# Patient Record
Sex: Male | Born: 1950 | Race: White | Hispanic: No | Marital: Married | Smoking: Never smoker
Health system: Southern US, Community
[De-identification: ages and names within clinical notes are randomized; demographics above are authoritative.]

## PROBLEM LIST (undated history)

## (undated) DIAGNOSIS — C140 Malignant neoplasm of pharynx, unspecified: Secondary | ICD-10-CM

## (undated) DIAGNOSIS — F419 Anxiety disorder, unspecified: Secondary | ICD-10-CM

## (undated) DIAGNOSIS — I10 Essential (primary) hypertension: Secondary | ICD-10-CM

## (undated) HISTORY — DX: Malignant neoplasm of pharynx, unspecified: C14.0

## (undated) HISTORY — PX: KNEE ARTHROSCOPY: SUR90

## (undated) HISTORY — PX: JOINT REPLACEMENT: SHX530

---

## 1999-10-13 ENCOUNTER — Emergency Department (HOSPITAL_COMMUNITY): Admission: EM | Admit: 1999-10-13 | Discharge: 1999-10-13 | Payer: Self-pay | Admitting: Emergency Medicine

## 1999-10-13 ENCOUNTER — Encounter: Payer: Self-pay | Admitting: Emergency Medicine

## 1999-10-19 ENCOUNTER — Emergency Department (HOSPITAL_COMMUNITY): Admission: EM | Admit: 1999-10-19 | Discharge: 1999-10-19 | Payer: Self-pay | Admitting: Emergency Medicine

## 2000-10-11 ENCOUNTER — Encounter: Payer: Self-pay | Admitting: Neurology

## 2000-10-11 ENCOUNTER — Ambulatory Visit (HOSPITAL_COMMUNITY): Admission: RE | Admit: 2000-10-11 | Discharge: 2000-10-11 | Payer: Self-pay | Admitting: Neurology

## 2003-12-27 ENCOUNTER — Ambulatory Visit (HOSPITAL_COMMUNITY): Admission: RE | Admit: 2003-12-27 | Discharge: 2003-12-27 | Payer: Self-pay | Admitting: Gastroenterology

## 2008-11-15 DIAGNOSIS — I10 Essential (primary) hypertension: Secondary | ICD-10-CM

## 2008-11-15 HISTORY — DX: Essential (primary) hypertension: I10

## 2012-07-04 ENCOUNTER — Other Ambulatory Visit: Payer: Self-pay | Admitting: Orthopedic Surgery

## 2012-07-05 ENCOUNTER — Other Ambulatory Visit: Payer: Self-pay | Admitting: Orthopedic Surgery

## 2012-07-07 ENCOUNTER — Encounter (HOSPITAL_COMMUNITY): Payer: Self-pay | Admitting: Pharmacy Technician

## 2012-07-07 NOTE — Pre-Procedure Instructions (Addendum)
20 Caleb Hoffman  07/07/2012   Your procedure is scheduled on: Thursday, August 30th.  Report to [facility] at 9:15 AM.  Call this number if you have problems the morning of surgery: [phone]   Remember:   Do not eat food or anything to drink:After Midnight.      Take these medicines the morning of surgery with A SIP OF WATER: Gabapentin (Neurontin) and amlodipine.  May take Hydrocodone-Acetaminophen (Norco) or Lorazepam (Ativan).  Stop taking Meloxicam (Mobic) , do not take any Aspirin, NSAIDs or Herbal Medications.   Do not wear jewelry, make-up or nail polish.  Do not wear lotions, powders, or perfumes. You may wear deodorant.  Do not shave 48 hours prior to surgery. Men may shave face and neck.  Do not bring valuables to the hospital.  Contacts, dentures or bridgework may not be worn into surgery.  Leave suitcase in the car. After surgery it may be brought to your room.  For patients admitted to the hospital, checkout time is 11:00 AM the day of discharge.   Patients discharged the day of surgery will not be allowed to drive home.  Name and phone number of your driver: _____________  Special Instructions: Incentive Spirometry - Practice and bring it with you on the day of surgery. and CHG Shower Use Special Wash: 1/2 bottle night before surgery and 1/2 bottle morning of surgery.   Please read over the following fact sheets that you were given: Pain Booklet, Coughing and Deep Breathing, Blood Transfusion Information and Surgical Site Infection Prevention

## 2012-07-10 ENCOUNTER — Encounter (HOSPITAL_COMMUNITY): Payer: Self-pay

## 2012-07-10 ENCOUNTER — Encounter (HOSPITAL_COMMUNITY)
Admission: RE | Admit: 2012-07-10 | Discharge: 2012-07-10 | Disposition: A | Discharge status: Home / Self Care | Payer: 59 | Source: Ambulatory Visit | Attending: Orthopedic Surgery | Admitting: Orthopedic Surgery

## 2012-07-10 HISTORY — DX: Essential (primary) hypertension: I10

## 2012-07-10 HISTORY — DX: Anxiety disorder, unspecified: F41.9

## 2012-07-10 LAB — URINALYSIS, ROUTINE W REFLEX MICROSCOPIC
Hgb urine dipstick: NEGATIVE
Nitrite: NEGATIVE
Protein, ur: NEGATIVE mg/dL
Urobilinogen, UA: 0.2 mg/dL (ref 0.0–1.0)

## 2012-07-10 LAB — TYPE AND SCREEN
ABO/RH(D): O POS
Antibody Screen: NEGATIVE

## 2012-07-10 LAB — CBC WITH DIFFERENTIAL/PLATELET
Basophils Absolute: 0 10*3/uL (ref 0.0–0.1)
Basophils Relative: 0 % (ref 0–1)
Eosinophils Absolute: 0 10*3/uL (ref 0.0–0.7)
Eosinophils Relative: 0 % (ref 0–5)
MCH: 31.3 pg (ref 26.0–34.0)
MCV: 89.8 fL (ref 78.0–100.0)
Neutrophils Relative %: 66 % (ref 43–77)
Platelets: 248 10*3/uL (ref 150–400)
RBC: 4.82 MIL/uL (ref 4.22–5.81)
RDW: 12.5 % (ref 11.5–15.5)

## 2012-07-10 LAB — COMPREHENSIVE METABOLIC PANEL
ALT: 30 U/L (ref 0–53)
AST: 16 U/L (ref 0–37)
Albumin: 3.9 g/dL (ref 3.5–5.2)
Alkaline Phosphatase: 64 U/L (ref 39–117)
Calcium: 9.7 mg/dL (ref 8.4–10.5)
GFR calc Af Amer: >90 mL/min (ref >90)
Glucose, Bld: 96 mg/dL (ref 70–99)
Potassium: 4 mEq/L (ref 3.5–5.1)
Sodium: 136 mEq/L (ref 135–145)
Total Protein: 7.1 g/dL (ref 6.0–8.3)

## 2012-07-10 LAB — SURGICAL PCR SCREEN: MRSA, PCR: NEGATIVE

## 2012-07-10 NOTE — Progress Notes (Signed)
Pt here for preadmission appt.   Reports that Primary care Physician: Shaune Pollack w/ [facility].  Denies any heart caths, stress and/or sleep studies.//L. Tekoa Hamor,Rn

## 2012-07-12 MED ORDER — DEXTROSE 5 % IV SOLN
3.0000 g | INTRAVENOUS | Status: AC
  Administered 2012-07-13: 2 g via INTRAVENOUS
  Filled 2012-07-12: qty 3000

## 2012-07-13 ENCOUNTER — Ambulatory Visit (HOSPITAL_COMMUNITY): Payer: 59 | Admitting: Anesthesiology

## 2012-07-13 ENCOUNTER — Encounter (HOSPITAL_COMMUNITY): Payer: Self-pay | Admitting: Anesthesiology

## 2012-07-13 ENCOUNTER — Encounter (HOSPITAL_COMMUNITY): Payer: Self-pay | Admitting: *Deleted

## 2012-07-13 ENCOUNTER — Ambulatory Visit (HOSPITAL_COMMUNITY): Payer: 59

## 2012-07-13 ENCOUNTER — Inpatient Hospital Stay (HOSPITAL_COMMUNITY)
Admission: RE | Admit: 2012-07-13 | Discharge: 2012-07-14 | DRG: 473 | Disposition: A | Discharge status: Home / Self Care | Payer: 59 | Source: Ambulatory Visit | Attending: Orthopedic Surgery | Admitting: Orthopedic Surgery

## 2012-07-13 ENCOUNTER — Encounter (HOSPITAL_COMMUNITY)
Admission: RE | Disposition: A | Discharge status: Home / Self Care | Payer: Self-pay | Source: Ambulatory Visit | Attending: Orthopedic Surgery

## 2012-07-13 DIAGNOSIS — I10 Essential (primary) hypertension: Secondary | ICD-10-CM | POA: Diagnosis present

## 2012-07-13 DIAGNOSIS — M5030 Other cervical disc degeneration, unspecified cervical region: Principal | ICD-10-CM | POA: Diagnosis present

## 2012-07-13 DIAGNOSIS — Z96659 Presence of unspecified artificial knee joint: Secondary | ICD-10-CM

## 2012-07-13 DIAGNOSIS — M5412 Radiculopathy, cervical region: Secondary | ICD-10-CM | POA: Diagnosis present

## 2012-07-13 DIAGNOSIS — M5410 Radiculopathy, site unspecified: Secondary | ICD-10-CM

## 2012-07-13 DIAGNOSIS — F411 Generalized anxiety disorder: Secondary | ICD-10-CM | POA: Diagnosis present

## 2012-07-13 HISTORY — PX: ANTERIOR CERVICAL DECOMP/DISCECTOMY FUSION: SHX1161

## 2012-07-13 SURGERY — ANTERIOR CERVICAL DECOMPRESSION/DISCECTOMY FUSION 3 LEVELS
Anesthesia: General | Site: Spine Cervical | Laterality: Right | Wound class: Clean

## 2012-07-13 MED ORDER — BUPIVACAINE-EPINEPHRINE PF 0.25-1:200000 % IJ SOLN
INTRAMUSCULAR | Status: AC
  Filled 2012-07-13: qty 30

## 2012-07-13 MED ORDER — ROCURONIUM BROMIDE 100 MG/10ML IV SOLN
INTRAVENOUS | Status: DC | PRN
  Administered 2012-07-13: 50 mg via INTRAVENOUS

## 2012-07-13 MED ORDER — GABAPENTIN 300 MG PO CAPS: 600.0000 mg | ORAL_CAPSULE | Freq: Three times a day (TID) | ORAL | Status: DC

## 2012-07-13 MED ORDER — ONDANSETRON HCL 4 MG/2ML IJ SOLN
INTRAMUSCULAR | Status: DC | PRN
  Administered 2012-07-13: 4 mg via INTRAVENOUS

## 2012-07-13 MED ORDER — PROPOFOL 10 MG/ML IV EMUL
INTRAVENOUS | Status: DC | PRN
  Administered 2012-07-13: 200 mg via INTRAVENOUS

## 2012-07-13 MED ORDER — 0.9 % SODIUM CHLORIDE (POUR BTL) OPTIME
TOPICAL | Status: DC | PRN
  Administered 2012-07-13: 1000 mL

## 2012-07-13 MED ORDER — PROMETHAZINE HCL 25 MG/ML IJ SOLN: 6.2500 mg | INTRAMUSCULAR | Status: DC | PRN

## 2012-07-13 MED ORDER — THROMBIN 20000 UNITS EX SOLR
CUTANEOUS | Status: AC
  Filled 2012-07-13: qty 20000

## 2012-07-13 MED ORDER — SODIUM CHLORIDE 0.9 % IJ SOLN: 3.0000 mL | INTRAMUSCULAR | Status: DC | PRN

## 2012-07-13 MED ORDER — MIDAZOLAM HCL 5 MG/5ML IJ SOLN
INTRAMUSCULAR | Status: DC | PRN
  Administered 2012-07-13 (×2): 1 mg via INTRAVENOUS

## 2012-07-13 MED ORDER — ZOLPIDEM TARTRATE 5 MG PO TABS
5.0000 mg | ORAL_TABLET | Freq: Every evening | ORAL | Status: DC | PRN
  Administered 2012-07-13: 5 mg via ORAL
  Filled 2012-07-13: qty 1

## 2012-07-13 MED ORDER — ONDANSETRON HCL 4 MG/2ML IJ SOLN: 4.0000 mg | INTRAMUSCULAR | Status: DC | PRN

## 2012-07-13 MED ORDER — HYDROMORPHONE HCL PF 1 MG/ML IJ SOLN
INTRAMUSCULAR | Status: AC
  Filled 2012-07-13: qty 1

## 2012-07-13 MED ORDER — OXYCODONE-ACETAMINOPHEN 5-325 MG PO TABS
1.0000 | ORAL_TABLET | ORAL | Status: DC | PRN
  Administered 2012-07-13: 2 via ORAL
  Filled 2012-07-13: qty 2

## 2012-07-13 MED ORDER — ADULT MULTIVITAMIN W/MINERALS CH
1.0000 | ORAL_TABLET | Freq: Every day | ORAL | Status: DC
  Administered 2012-07-13 – 2012-07-14 (×2): 1 via ORAL
  Filled 2012-07-13 (×2): qty 1

## 2012-07-13 MED ORDER — LACTATED RINGERS IV SOLN
INTRAVENOUS | Status: DC
  Administered 2012-07-13: 11:00:00 via INTRAVENOUS

## 2012-07-13 MED ORDER — BUPIVACAINE-EPINEPHRINE PF 0.25-1:200000 % IJ SOLN
INTRAMUSCULAR | Status: DC | PRN
  Administered 2012-07-13: 2 mL

## 2012-07-13 MED ORDER — MIDAZOLAM HCL 2 MG/2ML IJ SOLN
1.0000 mg | INTRAMUSCULAR | Status: AC | PRN
  Administered 2012-07-13: 1 mg via INTRAVENOUS
  Administered 2012-07-13: 18:00:00 via INTRAVENOUS

## 2012-07-13 MED ORDER — MORPHINE SULFATE 2 MG/ML IJ SOLN
2.0000 mg | INTRAMUSCULAR | Status: DC | PRN
  Administered 2012-07-14: 2 mg via INTRAVENOUS
  Filled 2012-07-13: qty 1

## 2012-07-13 MED ORDER — MIDAZOLAM HCL 2 MG/2ML IJ SOLN: 0.5000 mg | Freq: Once | INTRAMUSCULAR | Status: DC | PRN

## 2012-07-13 MED ORDER — LISINOPRIL 20 MG PO TABS
20.0000 mg | ORAL_TABLET | Freq: Every day | ORAL | Status: DC
  Administered 2012-07-13 – 2012-07-14 (×2): 20 mg via ORAL
  Filled 2012-07-13 (×2): qty 1

## 2012-07-13 MED ORDER — ALUM & MAG HYDROXIDE-SIMETH 200-200-20 MG/5ML PO SUSP: 30.0000 mL | Freq: Four times a day (QID) | ORAL | Status: DC | PRN

## 2012-07-13 MED ORDER — SENNA 8.6 MG PO TABS
1.0000 | ORAL_TABLET | Freq: Two times a day (BID) | ORAL | Status: DC
  Administered 2012-07-13 – 2012-07-14 (×2): 8.6 mg via ORAL
  Filled 2012-07-13 (×3): qty 1

## 2012-07-13 MED ORDER — ACETAMINOPHEN 650 MG RE SUPP: 650.0000 mg | RECTAL | Status: DC | PRN

## 2012-07-13 MED ORDER — MIDAZOLAM HCL 2 MG/2ML IJ SOLN
INTRAMUSCULAR | Status: AC
  Filled 2012-07-13: qty 2

## 2012-07-13 MED ORDER — PHENOL 1.4 % MT LIQD: 1.0000 | OROMUCOSAL | Status: DC | PRN

## 2012-07-13 MED ORDER — FENTANYL CITRATE 0.05 MG/ML IJ SOLN
INTRAMUSCULAR | Status: DC | PRN
  Administered 2012-07-13: 150 ug via INTRAVENOUS
  Administered 2012-07-13 (×2): 50 ug via INTRAVENOUS
  Administered 2012-07-13: 100 ug via INTRAVENOUS
  Administered 2012-07-13: 50 ug via INTRAVENOUS

## 2012-07-13 MED ORDER — GLYCOPYRROLATE 0.2 MG/ML IJ SOLN
INTRAMUSCULAR | Status: DC | PRN
  Administered 2012-07-13: .8 mg via INTRAVENOUS

## 2012-07-13 MED ORDER — DOCUSATE SODIUM 100 MG PO CAPS
100.0000 mg | ORAL_CAPSULE | Freq: Two times a day (BID) | ORAL | Status: DC
  Administered 2012-07-13 – 2012-07-14 (×2): 100 mg via ORAL
  Filled 2012-07-13 (×3): qty 1

## 2012-07-13 MED ORDER — SODIUM CHLORIDE 0.9 % IV SOLN: 250.0000 mL | INTRAVENOUS | Status: DC

## 2012-07-13 MED ORDER — MEPERIDINE HCL 25 MG/ML IJ SOLN: 6.2500 mg | INTRAMUSCULAR | Status: DC | PRN

## 2012-07-13 MED ORDER — GABAPENTIN 600 MG PO TABS
600.0000 mg | ORAL_TABLET | Freq: Three times a day (TID) | ORAL | Status: DC
  Administered 2012-07-13 – 2012-07-14 (×2): 600 mg via ORAL
  Filled 2012-07-13 (×4): qty 1

## 2012-07-13 MED ORDER — MENTHOL 3 MG MT LOZG: 1.0000 | LOZENGE | OROMUCOSAL | Status: DC | PRN

## 2012-07-13 MED ORDER — EPHEDRINE SULFATE 50 MG/ML IJ SOLN
INTRAMUSCULAR | Status: DC | PRN
  Administered 2012-07-13: 5 mg via INTRAVENOUS
  Administered 2012-07-13: 10 mg via INTRAVENOUS
  Administered 2012-07-13: 5 mg via INTRAVENOUS

## 2012-07-13 MED ORDER — NEOSTIGMINE METHYLSULFATE 1 MG/ML IJ SOLN
INTRAMUSCULAR | Status: DC | PRN
  Administered 2012-07-13: 5 mg via INTRAVENOUS

## 2012-07-13 MED ORDER — CEFAZOLIN SODIUM 1-5 GM-% IV SOLN
1.0000 g | Freq: Three times a day (TID) | INTRAVENOUS | Status: AC
  Administered 2012-07-13 – 2012-07-14 (×2): 1 g via INTRAVENOUS
  Filled 2012-07-13 (×2): qty 50

## 2012-07-13 MED ORDER — DIAZEPAM 5 MG PO TABS
5.0000 mg | ORAL_TABLET | Freq: Four times a day (QID) | ORAL | Status: DC | PRN
  Administered 2012-07-13: 5 mg via ORAL
  Filled 2012-07-13: qty 1

## 2012-07-13 MED ORDER — LISINOPRIL-HYDROCHLOROTHIAZIDE 20-12.5 MG PO TABS: 1.0000 | ORAL_TABLET | Freq: Every day | ORAL | Status: DC

## 2012-07-13 MED ORDER — LORAZEPAM 0.5 MG PO TABS
0.5000 mg | ORAL_TABLET | Freq: Every day | ORAL | Status: DC
  Administered 2012-07-13 – 2012-07-14 (×2): 0.5 mg via ORAL
  Filled 2012-07-13 (×2): qty 1

## 2012-07-13 MED ORDER — AMLODIPINE BESYLATE 5 MG PO TABS
5.0000 mg | ORAL_TABLET | Freq: Every day | ORAL | Status: DC
Start: 2012-07-13 — End: 2012-07-14
  Administered 2012-07-14: 5 mg via ORAL
  Filled 2012-07-13 (×2): qty 1

## 2012-07-13 MED ORDER — OMEGA-3-ACID ETHYL ESTERS 1 G PO CAPS
2.0000 g | ORAL_CAPSULE | Freq: Every day | ORAL | Status: DC
  Administered 2012-07-14: 2 g via ORAL
  Filled 2012-07-13 (×2): qty 2

## 2012-07-13 MED ORDER — THROMBIN 20000 UNITS EX SOLR
CUTANEOUS | Status: DC | PRN
  Administered 2012-07-13: 13:00:00 via TOPICAL

## 2012-07-13 MED ORDER — LIDOCAINE HCL (CARDIAC) 20 MG/ML IV SOLN
INTRAVENOUS | Status: DC | PRN
  Administered 2012-07-13: 25 mg via INTRAVENOUS

## 2012-07-13 MED ORDER — DEXTROSE 5 % IV SOLN
INTRAVENOUS | Status: DC | PRN
  Administered 2012-07-13: 12:00:00 via INTRAVENOUS

## 2012-07-13 MED ORDER — ACETAMINOPHEN 325 MG PO TABS: 650.0000 mg | ORAL_TABLET | ORAL | Status: DC | PRN

## 2012-07-13 MED ORDER — SODIUM CHLORIDE 0.9 % IJ SOLN
3.0000 mL | Freq: Two times a day (BID) | INTRAMUSCULAR | Status: DC
  Administered 2012-07-13 – 2012-07-14 (×2): 3 mL via INTRAVENOUS

## 2012-07-13 MED ORDER — HYDROCHLOROTHIAZIDE 12.5 MG PO CAPS
12.5000 mg | ORAL_CAPSULE | Freq: Every day | ORAL | Status: DC
  Administered 2012-07-13 – 2012-07-14 (×2): 12.5 mg via ORAL
  Filled 2012-07-13 (×2): qty 1

## 2012-07-13 MED ORDER — LACTATED RINGERS IV SOLN
INTRAVENOUS | Status: DC | PRN
  Administered 2012-07-13 (×4): via INTRAVENOUS

## 2012-07-13 MED ORDER — HYDROMORPHONE HCL PF 1 MG/ML IJ SOLN
0.2500 mg | INTRAMUSCULAR | Status: DC | PRN
  Administered 2012-07-13 (×4): 0.5 mg via INTRAVENOUS

## 2012-07-13 MED ORDER — POVIDONE-IODINE 7.5 % EX SOLN
Freq: Once | CUTANEOUS | Status: DC
  Filled 2012-07-13: qty 118

## 2012-07-13 MED ORDER — POTASSIUM CHLORIDE IN NACL 20-0.9 MEQ/L-% IV SOLN
INTRAVENOUS | Status: DC
  Administered 2012-07-13 – 2012-07-14 (×2): via INTRAVENOUS
  Filled 2012-07-13 (×3): qty 1000

## 2012-07-13 SURGICAL SUPPLY — 73 items
BENZOIN TINCTURE PRP APPL 2/3 (GAUZE/BANDAGES/DRESSINGS) ×2 IMPLANT
BIT DRILL NEURO 2X3.1 SFT TUCH (MISCELLANEOUS) ×1 IMPLANT
BLADE LONG MED 31X9 (MISCELLANEOUS) IMPLANT
BLADE SURG 15 STRL LF DISP TIS (BLADE) ×1 IMPLANT
BLADE SURG 15 STRL SS (BLADE) ×1
BLADE SURG ROTATE 9660 (MISCELLANEOUS) ×2 IMPLANT
BUR MATCHSTICK NEURO 3.0 LAGG (BURR) ×2 IMPLANT
CARTRIDGE OIL MAESTRO DRILL (MISCELLANEOUS) ×1 IMPLANT
CLOTH BEACON ORANGE TIMEOUT ST (SAFETY) ×2 IMPLANT
CLSR STERI-STRIP ANTIMIC 1/2X4 (GAUZE/BANDAGES/DRESSINGS) ×2 IMPLANT
COLLAR CERV LO CONTOUR FIRM DE (SOFTGOODS) IMPLANT
CORDS BIPOLAR (ELECTRODE) ×2 IMPLANT
COVER SURGICAL LIGHT HANDLE (MISCELLANEOUS) ×2 IMPLANT
CRADLE DONUT ADULT HEAD (MISCELLANEOUS) ×2 IMPLANT
DEVICE ENDSKLTN CRVCL 5MM-0SM (Orthopedic Implant) ×1 IMPLANT
DIFFUSER DRILL AIR PNEUMATIC (MISCELLANEOUS) ×2 IMPLANT
DRAIN JACKSON RD 7FR 3/32 (WOUND CARE) IMPLANT
DRAPE C-ARM 42X72 X-RAY (DRAPES) ×2 IMPLANT
DRAPE POUCH INSTRU U-SHP 10X18 (DRAPES) ×2 IMPLANT
DRAPE SURG 17X23 STRL (DRAPES) ×6 IMPLANT
DRILL NEURO 2X3.1 SOFT TOUCH (MISCELLANEOUS) ×2
DURAPREP 26ML APPLICATOR (WOUND CARE) ×2 IMPLANT
ELECT COATED BLADE 2.86 ST (ELECTRODE) ×2 IMPLANT
ELECT REM PT RETURN 9FT ADLT (ELECTROSURGICAL) ×2
ELECTRODE REM PT RTRN 9FT ADLT (ELECTROSURGICAL) ×1 IMPLANT
ENDOSKELETON CERVICAL 5MM-0SM (Orthopedic Implant) ×2 IMPLANT
EVACUATOR SILICONE 100CC (DRAIN) IMPLANT
GAUZE SPONGE 4X4 16PLY XRAY LF (GAUZE/BANDAGES/DRESSINGS) ×2 IMPLANT
GLOVE BIO SURGEON STRL SZ8 (GLOVE) ×2 IMPLANT
GLOVE BIOGEL PI IND STRL 8 (GLOVE) ×1 IMPLANT
GLOVE BIOGEL PI INDICATOR 8 (GLOVE) ×1
GOWN STRL NON-REIN LRG LVL3 (GOWN DISPOSABLE) ×2 IMPLANT
GOWN STRL REIN XL XLG (GOWN DISPOSABLE) ×2 IMPLANT
IMPL S ENDOSKEL TC 7 ODEG (Orthopedic Implant) ×2 IMPLANT
IMPLANT S ENDOSKEL TC 7 ODEG (Orthopedic Implant) ×4 IMPLANT
IV CATH 14GX2 1/4 (CATHETERS) ×2 IMPLANT
KIT BASIN OR (CUSTOM PROCEDURE TRAY) ×2 IMPLANT
KIT ROOM TURNOVER OR (KITS) ×2 IMPLANT
MANIFOLD NEPTUNE II (INSTRUMENTS) ×2 IMPLANT
NEEDLE 27GAX1X1/2 (NEEDLE) ×2 IMPLANT
NEEDLE SPNL 20GX3.5 QUINCKE YW (NEEDLE) ×2 IMPLANT
NS IRRIG 1000ML POUR BTL (IV SOLUTION) ×2 IMPLANT
OIL CARTRIDGE MAESTRO DRILL (MISCELLANEOUS) ×2
PACK ORTHO CERVICAL (CUSTOM PROCEDURE TRAY) ×2 IMPLANT
PAD ARMBOARD 7.5X6 YLW CONV (MISCELLANEOUS) ×4 IMPLANT
PATTIES SURGICAL .5 X.5 (GAUZE/BANDAGES/DRESSINGS) ×2 IMPLANT
PATTIES SURGICAL .5 X1 (DISPOSABLE) ×2 IMPLANT
PIN DISTRACTION 14 (PIN) ×4 IMPLANT
PIN DISTRACTION 14MM (PIN) IMPLANT
PLATE VECTRA 48MM (Plate) ×2 IMPLANT
PUTTY BONE DBX 5CC MIX (Putty) ×2 IMPLANT
SCREW 4.0X16MM (Screw) ×16 IMPLANT
SCREW 4.5X16MM (Screw) ×1 IMPLANT
SCREW BN 16X4.5XSLF DRL VA (Screw) ×1 IMPLANT
SPONGE GAUZE 4X4 12PLY (GAUZE/BANDAGES/DRESSINGS) ×2 IMPLANT
SPONGE INTESTINAL PEANUT (DISPOSABLE) ×4 IMPLANT
SPONGE SURGIFOAM ABS GEL 100 (HEMOSTASIS) ×2 IMPLANT
STRIP CLOSURE SKIN 1/2X4 (GAUZE/BANDAGES/DRESSINGS) ×2 IMPLANT
SURGIFLO TRUKIT (HEMOSTASIS) IMPLANT
SUT MNCRL AB 4-0 PS2 18 (SUTURE) IMPLANT
SUT SILK 4 0 (SUTURE)
SUT SILK 4-0 18XBRD TIE 12 (SUTURE) IMPLANT
SUT VIC AB 2-0 CT2 18 VCP726D (SUTURE) ×2 IMPLANT
SYR BULB IRRIGATION 50ML (SYRINGE) ×2 IMPLANT
SYR CONTROL 10ML LL (SYRINGE) ×2 IMPLANT
TAPE CLOTH 4X10 WHT NS (GAUZE/BANDAGES/DRESSINGS) ×2 IMPLANT
TAPE CLOTH SURG 4X10 WHT LF (GAUZE/BANDAGES/DRESSINGS) ×2 IMPLANT
TAPE UMBILICAL COTTON 1/8X30 (MISCELLANEOUS) ×4 IMPLANT
TOWEL OR 17X24 6PK STRL BLUE (TOWEL DISPOSABLE) ×2 IMPLANT
TOWEL OR 17X26 10 PK STRL BLUE (TOWEL DISPOSABLE) ×2 IMPLANT
TRAY FOLEY CATH 14FR (SET/KITS/TRAYS/PACK) ×2 IMPLANT
WATER STERILE IRR 1000ML POUR (IV SOLUTION) ×2 IMPLANT
YANKAUER SUCT BULB TIP NO VENT (SUCTIONS) ×2 IMPLANT

## 2012-07-13 NOTE — Transfer of Care (Signed)
Immediate Anesthesia Transfer of Care Note  Patient: Caleb Hoffman  Procedure(s) Performed: Procedure(s) (LRB): ANTERIOR CERVICAL DECOMPRESSION/DISCECTOMY FUSION 3 LEVELS (Right)  Patient Location: PACU  Anesthesia Type: General  Level of Consciousness: awake, oriented and patient cooperative  Airway & Oxygen Therapy: Patient Spontanous Breathing and Patient connected to face mask oxygen  Post-op Assessment: Report given to PACU RN, Post -op Vital signs reviewed and stable and Patient moving all extremities  Post vital signs: Reviewed and stable  Complications: No apparent anesthesia complications

## 2012-07-13 NOTE — Anesthesia Postprocedure Evaluation (Signed)
  Anesthesia Post-op Note  Patient: Caleb Hoffman  Procedure(s) Performed: Procedure(s) (LRB): ANTERIOR CERVICAL DECOMPRESSION/DISCECTOMY FUSION 3 LEVELS (Right)  Patient Location: PACU  Anesthesia Type: General  Level of Consciousness: awake, alert  and oriented  Airway and Oxygen Therapy: Patient Spontanous Breathing and Patient connected to nasal cannula oxygen  Post-op Pain: mild  Post-op Assessment: Post-op Vital signs reviewed, Patient's Cardiovascular Status Stable, Respiratory Function Stable, Patent Airway, No signs of Nausea or vomiting and Pain level controlled  Post-op Vital Signs: Reviewed and stable  Complications: No apparent anesthesia complications

## 2012-07-13 NOTE — H&P (Signed)
PREOPERATIVE H&P  Chief Complaint: right arm pain   HPI: Caleb Hoffman is a 61 y.o. male who presents with right arm pain  Past Medical History  Diagnosis Date  . Hypertension 2010  . Anxiety    Past Surgical History  Procedure Date  . Joint replacement     Left Knee x2.   History   Social History  . Marital Status: Married    Spouse Name: N/A    Number of Children: N/A  . Years of Education: N/A   Social History Main Topics  . Smoking status: Not on file  . Smokeless tobacco: Not on file  . Alcohol Use: Not on file  . Drug Use:   . Sexually Active:    Other Topics Concern  . Not on file   Social History Narrative  . No narrative on file   No family history on file. No Known Allergies Prior to Admission medications   Medication Sig Start Date End Date Taking? Authorizing Provider  Fluvoxamine Maleate 150 MG CP24 Take 150 mg by mouth 2 (two) times daily.   Yes Historical Provider, MD  gabapentin (NEURONTIN) 300 MG capsule Take 600 mg by mouth 3 (three) times daily.   Yes Historical Provider, MD  HYDROcodone-acetaminophen (NORCO) 7.5-325 MG per tablet Take 2 tablets by mouth every 4 (four) hours as needed. For pain.   Yes Historical Provider, MD  lisinopril-hydrochlorothiazide (PRINZIDE,ZESTORETIC) 20-12.5 MG per tablet Take 1 tablet by mouth daily.   Yes Historical Provider, MD  LORazepam (ATIVAN) 0.5 MG tablet Take 0.5 mg by mouth daily.   Yes Historical Provider, MD  amLODipine (NORVASC) 5 MG tablet Take 5 mg by mouth daily.    Historical Provider, MD  meloxicam (MOBIC) 15 MG tablet Take 15 mg by mouth daily.    Historical Provider, MD  Multiple Vitamin (MULTIVITAMIN WITH MINERALS) TABS Take 1 tablet by mouth daily.    Historical Provider, MD  omega-3 acid ethyl esters (LOVAZA) 1 G capsule Take 2 g by mouth daily.    Historical Provider, MD     All other systems have been reviewed and were otherwise negative with the exception of those mentioned in the HPI  and as above.  Physical Exam: There were no vitals filed for this visit.  General: Alert, no acute distress Cardiovascular: No pedal edema Respiratory: No cyanosis, no use of accessory musculature GI: No organomegaly, abdomen is soft and non-tender Skin: No lesions in the area of chief complaint Neurologic: Sensation intact distally Psychiatric: Patient is competent for consent with normal mood and affect Lymphatic: No axillary or cervical lymphadenopathy  MUSCULOSKELETAL: + spurlings on right  Assessment/Plan: Right arm pain Plan for Procedure(s): ANTERIOR CERVICAL DECOMPRESSION/DISCECTOMY FUSION 3 LEVELS   Emilee Hero, MD 07/13/2012 6:37 AM

## 2012-07-13 NOTE — Anesthesia Preprocedure Evaluation (Signed)
Anesthesia Evaluation  Patient identified by MRN, date of birth, ID band Patient awake    Reviewed: Allergy & Precautions, H&P , NPO status , Patient's Chart, lab work & pertinent test results  History of Anesthesia Complications Negative for: history of anesthetic complications  Airway Mallampati: II TM Distance: >3 FB Neck ROM: Full    Dental No notable dental hx. (+) Teeth Intact and Dental Advisory Given   Pulmonary neg pulmonary ROS,  breath sounds clear to auscultation  Pulmonary exam normal       Cardiovascular hypertension, Pt. 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, narcotic dependent negative psych ROS   GI/Hepatic negative GI ROS, Neg liver ROS,   Endo/Other  negative endocrine ROS  Renal/GU negative Renal ROS     Musculoskeletal   Abdominal   Peds  Hematology   Anesthesia Other Findings   Reproductive/Obstetrics                           Anesthesia Physical Anesthesia Plan  ASA: II  Anesthesia Plan: General   Post-op Pain Management:    Induction: Intravenous  Airway Management Planned: Oral ETT  Additional Equipment:   Intra-op Plan:   Post-operative Plan: Extubation in OR  Informed Consent: I have reviewed the patients History and Physical, chart, labs and discussed the procedure including the risks, benefits and alternatives for the proposed anesthesia with the patient or authorized representative who has indicated his/her understanding and acceptance.   Dental advisory given  Plan Discussed with: CRNA and Surgeon  Anesthesia Plan Comments: (Plan routine monitors, GETA)        Anesthesia Quick Evaluation

## 2012-07-13 NOTE — Preoperative (Signed)
Beta Blockers   Reason not to administer Beta Blockers:Not Applicable 

## 2012-07-14 ENCOUNTER — Encounter (HOSPITAL_COMMUNITY): Payer: Self-pay | Admitting: Orthopedic Surgery

## 2012-07-14 NOTE — Progress Notes (Signed)
Patient doing well. Minimal neck pain, right arm pain resolved.  BP 145/73  Pulse 78  Temp 98.5 F (36.9 C) (Oral)  Resp 18  Ht 6' (1.829 m)  Wt 86.183 kg (190 lb)  BMI 25.77 kg/m2  SpO2 97%  NVI Dressing CDI  POD #1 after C4-7 ACDF - d/c home today with percocet/valium - philly collar to bedside for showering - f/u in 2 weks

## 2012-07-14 NOTE — Op Note (Signed)
Caleb Hoffman, Caleb Hoffman              ACCOUNT NO.:  [id number]  MEDICAL RECORD NO.:  [id number]  LOCATION:  [id number]                        FACILITY:  [facility]  PHYSICIAN:  Estill Bamberg, MD      DATE OF BIRTH:  04/30/1951  DATE OF PROCEDURE:  07/13/2012                              OPERATIVE REPORT   PREOPERATIVE DIAGNOSES: 1. Right-sided cervical radiculopathy. 2. 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  POSTOPERATIVE DIAGNOSES: 1. Right-sided cervical radiculopathy. 2. 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  PROCEDURE: 1. Anterior cervical decompression and fusion C4-5, C5-6, C6-7. 2. Placement of anterior instrumentation, C4-C7. 3. Insertion of interbody device x3 (Titan interbody spacer). 4. Use of local autograft. 5. Use of morselized allograft (DBX mixed). 6. Intraoperative use of fluoroscopy.  SURGEON:  Estill Bamberg, MD  ASSISTANT:  Janace Litten, OPA  ANESTHESIA:  General endotracheal anesthesia.  COMPLICATIONS:  None.  DISPOSITION:  Stable.  ESTIMATED BLOOD LOSS:  Minimal.  INDICATIONS FOR PROCEDURE:  Briefly, [name] is a very pleasant 61- year-old gentleman who did initially present to my office on July 04, 2012 with severe neck and right arm pain.  The patient did have extensive conservative care including epidural injections as well as medications and other nonoperative measures including physical therapy. The patient continued to have severe pain in his right arm.  I did review an MRI which was notable for moderate to severe foraminal stenosis associated with the C4-5, C5-6, and C6-7 levels.  Given the [name] ongoing symptoms and failure of conservative care, we did have a discussion regarding going forward with an anterior cervical decompression and fusion at the 3 levels that did correspond to the [name] pain.  The patient fully understood the risks and limitations of the procedure as outlined in my preoperative note and did wish  to proceed.  OPERATIVE DETAILS:  On July 13, 2012, the patient was brought to surgery and general endotracheal anesthesia was administered.  The patient was placed supine on a well-padded hospital bed.  All bony prominences were meticulously padded.  The arms were secured to the [name] side.  The ulnar nerves were protected.  The neck was placed into a gentle degree of extension and I did bring in lateral fluoroscopy to help optimize location of the incision.  Transverse incision was then made, centered over the C5-6 interspace after prepping the neck.  Also of note, a time-out procedure was performed.  The platysma was sharply incised.  The plane between the sternocleidomastoid muscle laterally and strap muscles medially was readily identified and explored.  The anterior cervical spine was readily noted.  I did obtain an additional lateral fluoroscopic view to confirm the appropriate operative levels. I then subperiosteally exposed the vertebral bodies of C4, C5, C6, and C7.  Osteophytes were abundant at the C5-6 level and were removed using a rongeur and placed on the back table for later use.  I then turned my attention towards the C6-7 interspace.  Caspar pins were placed into the C6 and C7 vertebral bodies and distraction was applied.  Of note, a self- retaining Shadow-Line retractor was placed as well.  I did use a 15 blade knife to perform an annulotomy and I  did use a series of curettes and Kerrison punches to perform a thorough and complete diskectomy.  The posterior longitudinal ligament was then encountered and was then entered using a nerve hook followed by #1 Kerrison.  A #2 Kerrison was then used to go forward with a thorough neural foraminal decompression to the right side.  Of note, there were large disk fragments identified in the neural foramen on the right side.  These fragments were clearly noted to be causing compression of the exiting C7 nerve.  At  the termination of this portion of the procedure, I was easily able to pass a nerve hook out the neural foramen on the right side.  I then placed a series of trials and I did feel that a 7 mm parallel trial would be the most appropriate fit.  I then packed a medium 7 mm parallel trial with autograft obtained for removing the osteophytes in addition to allograft in the form of DBX mix.  The implant was then tamped into position in the usual fashion.  I did note an excellent press fit.  Distraction was then discontinued and the Caspar pin was removed from the C7 vertebral body.  I then turned my attention towards the C5-6 interspace.  A Caspar pin was placed into the C5 vertebral body and distraction was applied. Diskectomy was performed in the manner described previously.  Again, the posterior longitudinal ligament was entered and a thorough neural foraminal decompression was performed on the right side.  There was significant compression identified.  Decompression was removed and at this point, I was easily able to pass a nerve hook out the neural foramen on the right side.  I again placed a series of trials and I did feel that a 6 mm interbody trial would be the most appropriate fit.  A 6 mm parallel implant was then packed with autograft and allograft and tamped into position.  I then turned my attention towards the C4-5 interspace.  A diskectomy was performed in the manner described previously.  At this particular level, I did feel that a 7 mm implant would be the most appropriate fit.  Of note, a thorough neural foraminal decompression was performed and there was no undue pressure on the exiting C5 nerve.  The implant was then packed with autograft and allograft and tamped into position in the usual fashion as described previously.  I was very pleased with press fit of each of the implants. I then chose a 48 mm Vectra plate.  The plate was contoured into the appropriate degree of  lordosis.  Input applied over the anterior cervical spine.  I then placed a total of 2 variable angle self- drilling, self-tapping 16 mm screws in each vertebral body for a total of 8 screws.  I was very pleased with the final press fit of each of the screws.  I then obtained an AP and lateral fluoroscopic view and was very pleased with the appearance.  I then removed the Caspar pins and bone wax were placed into place.  I then removed the retractor and I explored the wound for any undue bleeding and there was none encountered.  The platysma was then closed using 2-0 Vicryl and the skin was closed using 4-0 Monocryl.  Benzoin and Steri-Strips were applied followed by sterile dressing.  All instrument counts were correct at the termination of the procedure.  Of note, Janace Litten was my assistant throughout the procedure and aided in essential retraction and suctioning required  throughout the surgery.     Estill Bamberg, MD     MD/MEDQ  D:  07/13/2012  T:  07/14/2012  Job:  [id number]

## 2012-07-19 NOTE — Discharge Summary (Signed)
NAMEMIRAN, KAUTZMAN              ACCOUNT NO.:  [id number]  MEDICAL RECORD NO.:  [id number]  LOCATION:  [id number]                        FACILITY:  [facility]  PHYSICIAN:  Estill Bamberg, MD      DATE OF BIRTH:  [birth year]  DATE OF ADMISSION:  07/13/2012 DATE OF DISCHARGE:  07/14/2012                              DISCHARGE SUMMARY   ADMISSION DIAGNOSES: 1. Right-sided cervical radiculopathy. 2. C4-C5, C5-C6, and C6-C7 degenerative disk disease.  DISCHARGE DIAGNOSES: 1. Right-sided cervical radiculopathy. 2. C4-C5, C5-C6, and C6-C7 degenerative disk disease.  ADMITTING HISTORY:  Briefly, Caleb Hoffman is a pleasant 61 year old gentleman who presented to me with severe pain in his right arm.  An MRI was notable for foraminal stenosis at 3 levels, and the patient was admitted on July 13, 2012, for operative intervention.  HOSPITAL COURSE:  On July 13, 2012, the patient underwent a three- level ACDF at the levels reflected above.  The patient tolerated the procedure well and was transferred to recovery in stable condition.  The patient was evaluated by me in the morning of postoperative day #1.  The patient's preoperative arm pain was resolved.  The patient did have some minimal neck discomfort and neurovascularly intact on the morning of postoperative day #1.  The patient was uneventfully discharged home on the morning of postoperative day #1.  DISCHARGE INSTRUCTIONS:  The patient will take Percocet for pain and Valium for spasms.  He will be maintained in his Aspen cervical collar. He was given a [place] collar to be used while showering.  The patient will follow up in my office in approximately 2 weeks after his procedure.     Estill Bamberg, MD     MD/MEDQ  D:  07/19/2012  T:  07/19/2012  Job:  [id number]

## 2013-11-11 IMAGING — CR DG CERVICAL SPINE 2 OR 3 VIEWS
2 series · 2 of 2 positions shown · non-contrast
Comparison: MRI from 02/28/2012

CLINICAL DATA: ACDF at 3 levels.

CERVICAL SPINE - 2-3 VIEW

[view not recorded (1 of 2)]
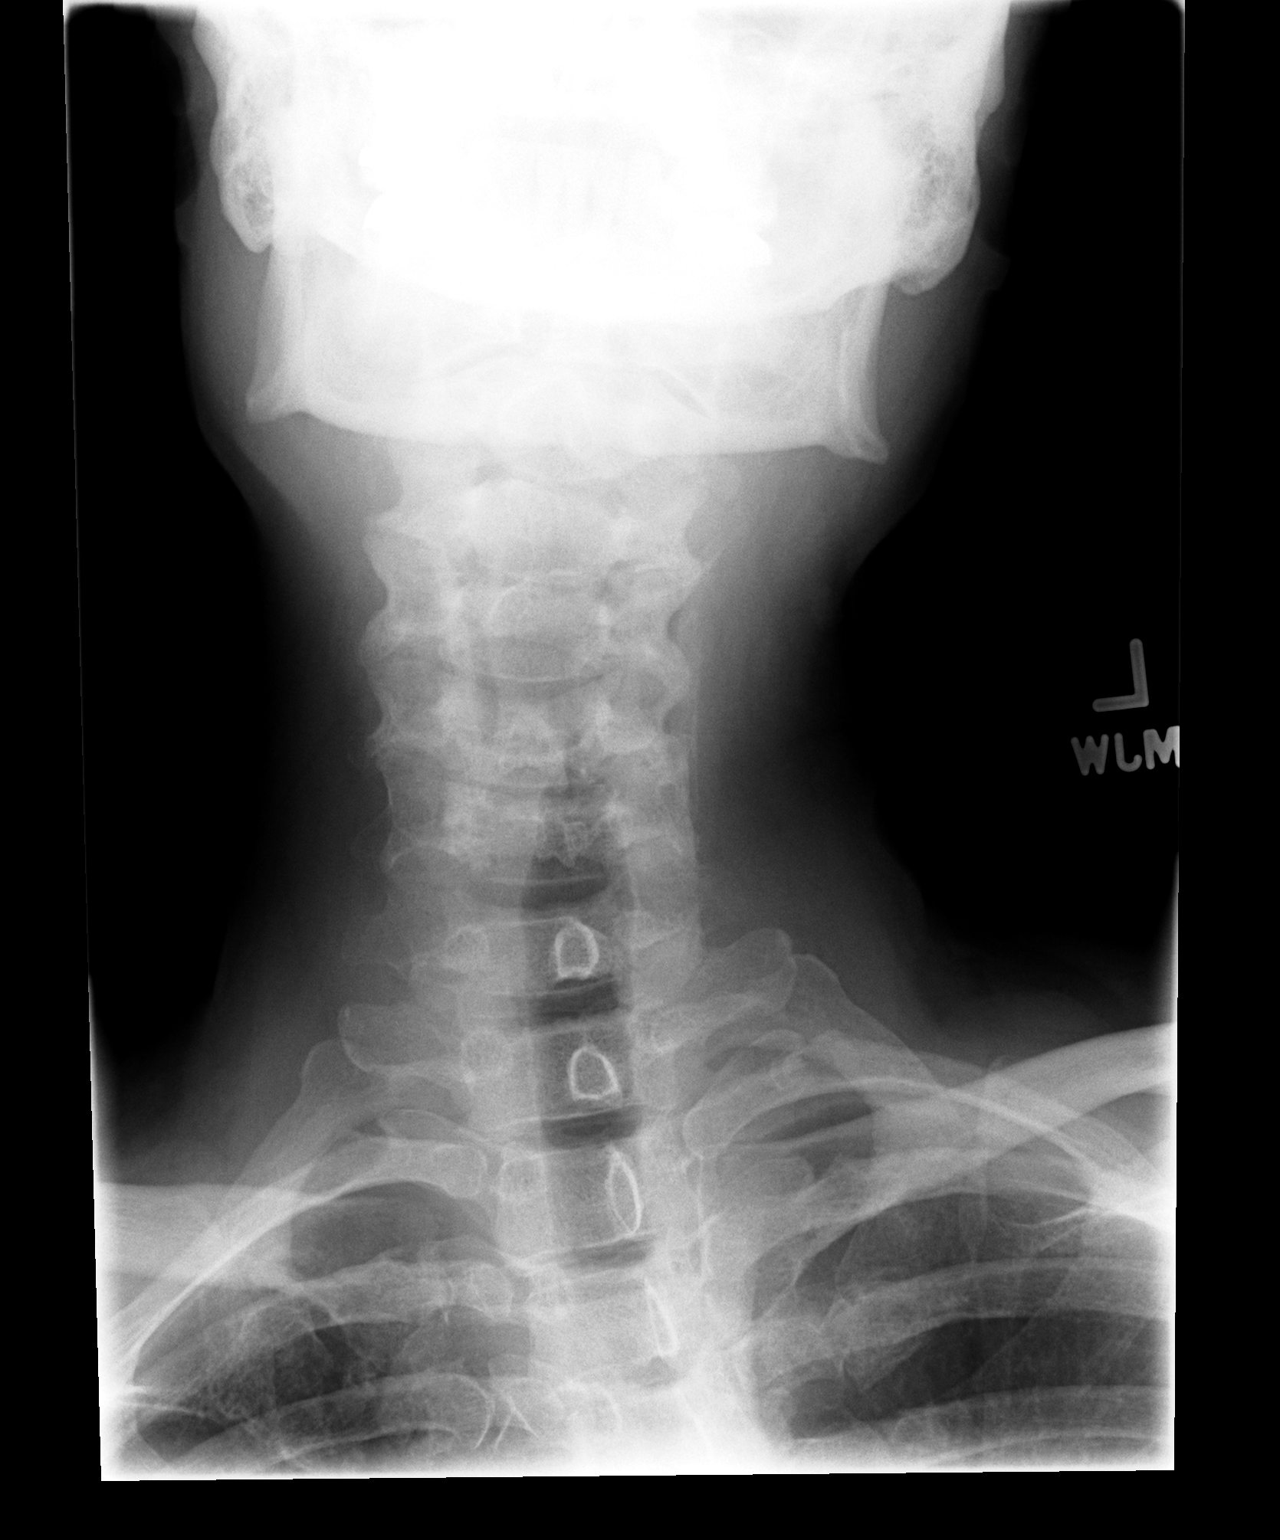

[view not recorded (2 of 2)]
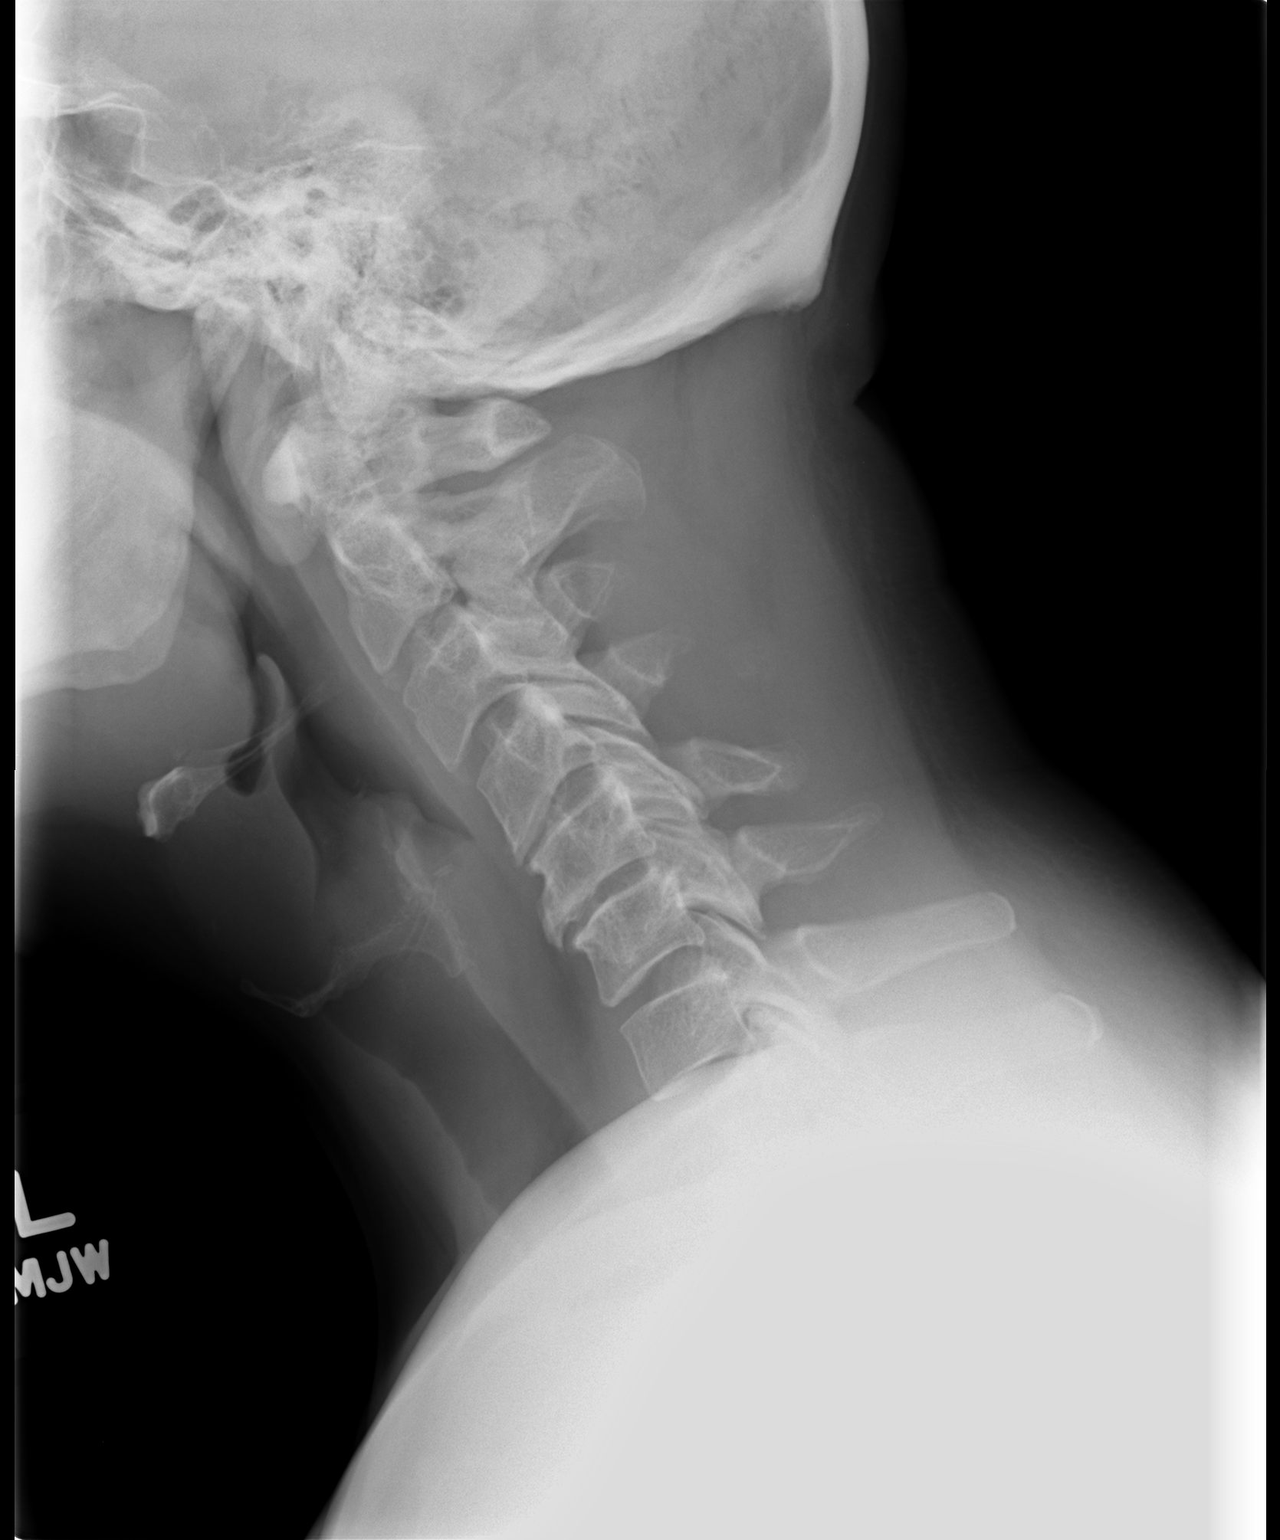

[2 of 2 positions shown; findings below may reference images not displayed]

FINDINGS: Two-view exam shows reversal of the normal cervical lordosis.  Loss
of disc height is evident at C4-5 and C5-6 with prompt minute
anterior spurs at C5-6.  No prevertebral soft tissue swelling.
IMPRESSION: Degenerative changes at C4-5 and C5-6.

## 2014-11-06 ENCOUNTER — Encounter (HOSPITAL_COMMUNITY): Payer: Self-pay | Admitting: Emergency Medicine

## 2014-11-06 ENCOUNTER — Emergency Department (HOSPITAL_COMMUNITY): Payer: 59

## 2014-11-06 ENCOUNTER — Emergency Department (HOSPITAL_COMMUNITY)
Admission: EM | Admit: 2014-11-06 | Discharge: 2014-11-06 | Disposition: A | Discharge status: Home / Self Care | Payer: 59 | Attending: Emergency Medicine | Admitting: Emergency Medicine

## 2014-11-06 DIAGNOSIS — S199XXA Unspecified injury of neck, initial encounter: Secondary | ICD-10-CM | POA: Diagnosis not present

## 2014-11-06 DIAGNOSIS — Z79899 Other long term (current) drug therapy: Secondary | ICD-10-CM | POA: Diagnosis not present

## 2014-11-06 DIAGNOSIS — I10 Essential (primary) hypertension: Secondary | ICD-10-CM | POA: Insufficient documentation

## 2014-11-06 DIAGNOSIS — Y92410 Unspecified street and highway as the place of occurrence of the external cause: Secondary | ICD-10-CM | POA: Diagnosis not present

## 2014-11-06 DIAGNOSIS — F419 Anxiety disorder, unspecified: Secondary | ICD-10-CM | POA: Diagnosis not present

## 2014-11-06 DIAGNOSIS — Y998 Other external cause status: Secondary | ICD-10-CM | POA: Insufficient documentation

## 2014-11-06 DIAGNOSIS — Y9389 Activity, other specified: Secondary | ICD-10-CM | POA: Diagnosis not present

## 2014-11-06 DIAGNOSIS — M542 Cervicalgia: Secondary | ICD-10-CM

## 2014-11-06 MED ORDER — NAPROXEN 500 MG PO TABS: 500.0000 mg | ORAL_TABLET | Freq: Two times a day (BID) | ORAL | Status: DC
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
## 2014-11-06 NOTE — ED Notes (Signed)
Pt to radiology.
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
## 2017-01-18 ENCOUNTER — Institutional Professional Consult (permissible substitution): Admitting: Neurology

## 2017-02-01 ENCOUNTER — Encounter: Payer: Self-pay | Admitting: Neurology

## 2017-02-01 ENCOUNTER — Ambulatory Visit (INDEPENDENT_AMBULATORY_CARE_PROVIDER_SITE_OTHER): Payer: 59 | Admitting: Neurology

## 2017-02-01 ENCOUNTER — Encounter (INDEPENDENT_AMBULATORY_CARE_PROVIDER_SITE_OTHER): Payer: Self-pay

## 2017-02-01 VITALS — BP 115/67 | HR 68 | Ht 72.0 in | Wt 203.0 lb

## 2017-02-01 DIAGNOSIS — G4752 REM sleep behavior disorder: Secondary | ICD-10-CM

## 2017-02-01 DIAGNOSIS — G478 Other sleep disorders: Secondary | ICD-10-CM

## 2017-02-01 DIAGNOSIS — G4710 Hypersomnia, unspecified: Secondary | ICD-10-CM

## 2017-02-01 DIAGNOSIS — R0683 Snoring: Secondary | ICD-10-CM

## 2017-02-01 DIAGNOSIS — G4730 Sleep apnea, unspecified: Secondary | ICD-10-CM

## 2017-02-01 NOTE — Progress Notes (Signed)
SLEEP MEDICINE CLINIC   Provider:  [name], M D  Referring Provider: Darcus Austin, MD Primary Care Physician:  Marjorie Smolder, MD  Chief Complaint  Patient presents with  . New Patient (Initial Visit)    had sleep study in 1999, normal, needs another sleep study    HPI:  Caleb Hoffman is a 66 y.o. male , seen here as a referral/ revisit  from Dr. Toy Care. [name] is a former patient of [name].   Mr. Grimley reports EDS- excessive daytime sleepiness.  He usually can go to sleep and stay asleep for the night, but his spouse has noted that he is restless and sometimes twitching. He will have one or 2 bathroom breaks at night, and according to his wife he snoring, too. He has sometimes woken himself by snoring. What is more concerning is his daytime degree of sleepiness. He struggles to stay awake when he is not physically active or mentally stimulated. This affects also his ability to stay alert at work. He has fallen asleep. It can be before and after lunch. More often in the afternoons. He reported the sleepiness to Dr. Toy Care who placed a request for a sleep study in consult on 01/05/2079. He has known his psychiatrist for the last 7 years and apparently this was not always a concern and is rather new. He does not report dream activity when falling asleep in daytime but he reports vivid dreams at night.  Sleep habits are as follows: He usually watches TV or reads before he goes to sleep and his time to go to the bedroom is about 11 PM. Does not consume  alcohol in the evening hours, only on week end dinner time .The bedroom is comfortable, cool, dark and quiet and he shares a bedroom with his wife. He prefers to sleep on his side and usually uses one pillow only.  Once in bed he goes to sleep within 10-15 minutes. He will wake up between 12:30 and 1 AM and again between 3:30 and 4 AM to go to the bathroom. He will have dreams, some very vivid and scary ones, and if he wakes he  knows that these were not real. He can continue the same dream, in "installments". His alarm clock is set for 6:30, and he will rise at that time to go to work. He often wishes he could stay a little longer in bed but he has no trouble rising and starting his day.   Sleep medical history and family sleep history:  Younger brother has OSA, sleeps in a chair, is super obese and has EDS. Between the ages of 42 and 20  the patient  Experienced night terrors, but not necessarily sleepwalking  and not enuresis.  He has had nasal septum surgery in  1990 , anterior neck fusion in  8/ 2003. Dr. Phylliss Bob at [employer] [facility].  Retired Nature conservation officer, diagnosed with PTSD, [employer] over 20 years. The event of PTSD was over 30 years ago.  Social history: Married, 2 children, daughters , retired Social research officer, government , Actor. He drinks 3-4 coffees per week and usually in the morning hours. He does not consume iced tea or soda. Non smoker.    Review of Systems: Out of a complete 14 system review, the patient complains of only the following symptoms, and all other reviewed systems are negative. Snoring, EDS   Epworth score 12  , Fatigue severity score 24   , depression score 2/15  How likely are you to doze in the following situations: 0 = not likely, 1 = slight chance, 2 = moderate chance, 3 = high chance  Sitting and Reading? 2 Watching Television?2 Sitting inactive in a public place (theater or meeting)?1 As a passenger in a car for an hour without a break?1 Lying down in the afternoon when circumstances permit?3 Sitting and talking to someone?1 Sitting quietly after lunch without alcohol?2 In a car, while stopped for a few minutes in traffic?0   Total =    Social History   Social History  . Marital status: Married    Spouse name: N/A  . Number of children: N/A  . Years of education: N/A   Occupational History  . Not on file.   Social History Main Topics  . Smoking status: Never Smoker    . Smokeless tobacco: Never Used  . Alcohol use Not on file  . Drug use: Unknown  . Sexual activity: Not on file   Other Topics Concern  . Not on file   Social History Narrative  . No narrative on file    No family history on file.  Past Medical History:  Diagnosis Date  . Anxiety   . Hypertension 2010    Past Surgical History:  Procedure Laterality Date  . ANTERIOR CERVICAL DECOMP/DISCECTOMY FUSION  07/13/2012   Procedure: ANTERIOR CERVICAL DECOMPRESSION/DISCECTOMY FUSION 3 LEVELS;  Surgeon: Sinclair Ship, MD;  Location: [facility];  Service: Orthopedics;  Laterality: Right;  Anterior cervical decompression fusion, cervical 4-5, cervical 5-6, cervical 6-7 with instrumentation, allograft.  [name] JOINT REPLACEMENT     Left Knee x2.    Current Outpatient Prescriptions  Medication Sig Dispense Refill  . amLODipine (NORVASC) 5 MG tablet Take 10 mg by mouth daily.     [name] aspirin EC 81 MG tablet Take 81 mg by mouth daily.    . Fluvoxamine Maleate 150 MG CP24 Take 150 mg by mouth 2 (two) times daily.    [name] lisinopril-hydrochlorothiazide (PRINZIDE,ZESTORETIC) 20-12.5 MG per tablet Take 1 tablet by mouth daily.    [name] LORazepam (ATIVAN) 0.5 MG tablet Take 0.5 mg by mouth daily.    . Multiple Vitamin (MULTIVITAMIN WITH MINERALS) TABS Take 1 tablet by mouth daily.     No current facility-administered medications for this visit.     Allergies as of 02/01/2017  . (No Known Allergies)    Vitals: BP 115/67   Pulse 68   Ht 6' (1.829 m)   Wt 203 lb (92.1 kg)   BMI 27.53 kg/m  Last Weight:  Wt Readings from Last 1 Encounters:  02/01/17 203 lb (92.1 kg)   OMV:[facility] mass index is 27.53 kg/m.     Last Height:   Ht Readings from Last 1 Encounters:  02/01/17 6' (1.829 m)    Physical exam:  General: The patient is awake, alert and appears not in acute distress. The patient is well groomed. Head: Normocephalic, atraumatic. Neck is supple. Mallampati 3 ,  neck circumference: 17.5  inches . Nasal airflow patent , TMJ is evident. Retrognathia is seen.  Cardiovascular:  Regular rate and rhythm , without  murmurs or carotid bruit, and without distended neck veins. Respiratory: Lungs are clear to auscultation. Skin:  Without evidence of edema, or rash Trunk: BMI is 28. The patient's posture is erect.  Neurologic exam : The patient is awake and alert, oriented to place and time.   Attention span & concentration ability appears normal.  Speech is fluent,  without  dysarthria, dysphonia or aphasia.  Mood and affect are appropriate.  Cranial nerves: Pupils are equal and briskly reactive to light.  Funduscopic exam without evidence of pallor or edema.  Extraocular movements  in vertical and horizontal planes intact and without nystagmus. Visual fields by finger perimetry are intact. Hearing to finger rub intact.  Facial sensation intact to fine touch. Facial motor strength is symmetric and tongue and uvula move midline. Shoulder shrug was symmetrical.   Motor exam:  Normal tone, muscle bulk and symmetric strength in all extremities. No atrophy.  Sensory:  Fine touch, pinprick and vibration, Proprioception tested in the upper extremities was normal. Coordination: Rapid alternating movements , Finger-to-nose maneuver without evidence of ataxia, dysmetria or tremor. Gait and station: Patient walks without assistive device and is able unassisted to climb up to the exam table.  Strength within normal limits. Stance is stable and normal. Turns with  3 Steps. Romberg testing is negative.  Deep tendon reflexes: in the  upper and lower extremities are symmetric and intact. Babinski maneuver response is downgoing.  The patient was advised of the nature of the diagnosed sleep disorder , the treatment options and risks for general a health and wellness arising from not treating the condition.  I spent more than 35 minutes of face to face time with the patient. Greater than 50% of time was  spent in counseling and coordination of care. We have discussed the diagnosis and differential and I answered the patient's questions.     Assessment:  After physical and neurologic examination, review of laboratory studies,  Personal review of imaging studies, reports of other /same  Imaging studies ,  Results of polysomnography/ neurophysiology testing and pre-existing records as far as provided in visit., my assessment is   1) Mr. Causby reports that his wife has witnessed him to snore but he has experienced waking up from his own snoring. She has also witnessed crescendo breathing which is indicative of the presence of apnea. While he falls asleep on his side he does rolled into the supine sleep position at night and usually this will make for louder snoring and more frequent apnea.  2) He has  had recent REM sleep activity, probably REM BD- he has kicked and yelled, once fell out of bed. This can be a PTSD residual.   He does not have comorbidities of the cardiovascular system, no history of strokes, atrial fibrillation, presyncope, vertigo or sleep related pain and headaches. For this reason I think he is scheduled to undergo a sleep test for apnea screening and evaluation of PLMs during REM. [name] He is currently on medication that will suppress some REM sleep. Including Luvox. For PTSD.    Plan:  Treatment plan and additional workup : Parasomnia montage PSG ;    [employer] and [employer] .  Rv after sleep test.      [name] MD  02/01/2017   CC: Dr Toy Care, MD

## 2017-02-01 NOTE — Patient Instructions (Signed)

## 2017-02-28 ENCOUNTER — Encounter: Payer: Self-pay | Admitting: Neurology

## 2017-03-23 ENCOUNTER — Ambulatory Visit (INDEPENDENT_AMBULATORY_CARE_PROVIDER_SITE_OTHER): Payer: 59 | Admitting: Neurology

## 2017-03-23 DIAGNOSIS — G4710 Hypersomnia, unspecified: Secondary | ICD-10-CM

## 2017-03-23 DIAGNOSIS — G4752 REM sleep behavior disorder: Secondary | ICD-10-CM | POA: Diagnosis not present

## 2017-03-23 DIAGNOSIS — G478 Other sleep disorders: Secondary | ICD-10-CM

## 2017-03-23 DIAGNOSIS — G4730 Sleep apnea, unspecified: Secondary | ICD-10-CM

## 2017-03-23 DIAGNOSIS — R0683 Snoring: Secondary | ICD-10-CM

## 2017-03-29 ENCOUNTER — Telehealth: Payer: Self-pay | Admitting: Neurology

## 2017-03-29 NOTE — Procedures (Signed)
PATIENT'S NAME:  Caleb Hoffman, Caleb Hoffman DOB:      08-27-1951      MR#:    [id number]     DATE OF RECORDING: 03/23/2017 REFERRING M.D.:  Darcus Austin MD, Marquis Lunch, MD  Study Performed:   Baseline Polysomnogram HISTORY:  Mr. Codd reports EDS- excessive daytime sleepiness.  He usually can go to sleep and stay asleep for the night, but his spouse has noted that he is restless and sometimes twitching. He will have 2 bathroom breaks and according to his wife he snoring, too. He has sometimes woken himself by snoring. Most  concerning is his daytime degree of sleepiness. He struggles to stay awake when he is not physically active or mentally stimulated. This affects also his ability to stay alert at work. He does not report dream activity when falling asleep in daytime,  but he reports vivid dreams at night. The patient endorsed the Epworth Sleepiness Scale at 12/24 points.   The patient's weight 203 pounds with a height of 72 (inches), resulting in a BMI of 27.5 kg/m2. The patient's neck circumference measured 17.5 inches.  CURRENT MEDICATIONS: Amlodipine, Aspirin, Fluvoxamine, Lisinopril, Lorazepam and Multi-Vitamin   PROCEDURE:  This is a multichannel digital polysomnogram utilizing the Somnostar 11.2 system.  Electrodes and sensors were applied and monitored per AASM Specifications.   EEG, EOG, Chin and Limb EMG, were sampled at 200 Hz.  ECG, Snore and Nasal Pressure, Thermal Airflow, Respiratory Effort, CPAP Flow and Pressure, Oximetry was sampled at 50 Hz. Digital video and audio were recorded.      BASELINE STUDY: Lights Out was at 22:41 and Lights On at 04:55.  Total recording time (TRT) was 375 minutes, with a total sleep time (TST) of 294.5 minutes.   The patient's sleep latency was 50 minutes.  REM latency was 256.5 minutes.  The sleep efficiency was 78.5 %.     SLEEP ARCHITECTURE: WASO (Wake after sleep onset) was 59.5 minutes.  There were 14.5 minutes in Stage N1, 215.5 minutes Stage N2, 0 minutes  Stage N3 and 64.5 minutes in Stage REM.  The percentage of Stage N1 was 4.9%, Stage N2 was 73.2%, Stage N3 was 0% and Stage R (REM sleep) was 21.9%.    RESPIRATORY ANALYSIS:  There were a total of 56 respiratory events:  1 obstructive apnea, 0 central apneas and 2 mixed apneas with 53 hypopneas. The patient also had 0 respiratory event related arousals (RERAs).    The total APNEA/HYPOPNEA INDEX (AHI) was 11.4/hour and the total RESPIRATORY DISTURBANCE INDEX was 11.4 /hour.  0 events occurred in REM sleep. The REM AHI was 0 /hour, versus a non-REM AHI of 14.6. The patient spent 77.5 minutes of total sleep time in the supine position and 217 minutes in non-supine. The supine AHI was 37.9 versus a non-supine AHI of 1.9.  OXYGEN SATURATION & C02:  The Wake baseline 02 saturation was 96%, with the lowest being 74%. Time spent below 89% saturation equaled 11 minutes.   PERIODIC LIMB MOVEMENTS:  The patient had a total of 386 Periodic Limb Movements.  The Periodic Limb Movement (PLM) index was 78.6 and the PLM Arousal index was 23.8/hour. The arousals were noted as: 27 were spontaneous, 117 were associated with PLMs, and 55 were associated with respiratory events.  Audio and video analysis did not show any abnormal or unusual movements, behaviors, phonations or vocalizations.   The patient took one bathroom break. Snoring was noted, mostly in supine. EKG was in keeping  with normal sinus rhythm (NSR).  Post-study, the patient indicated that sleep was the same as usual.   IMPRESSION:  1. Obstructive Sleep Apnea(OSA) AHI 11.9 , hypopnea with supine dependent of AHI 37 /hr.  2. Severe  Periodic Limb Movement Disorder (PLMD) 3. Primary Snoring  RECOMMENDATIONS:  1. The main cause of sleep fragmentation was PLM disorder.  2. Advise full-night, attended, CPAP titration study to optimize therapy.   3. Positional therapy is advised. 4. Further information regarding OSA may be obtained from [employer] (www.sleepfoundation.org) or American Sleep Apnea Association (www.sleepapnea.org). 5. Avoid caffeine-containing beverages and chocolate. 6. Correlate clinically for a history consistent with regarding restless legs syndrome (RLS).    Consider secondary restless legs syndrome.  Pharmacotherapy may be warranted.  Obtain a serum ferritin level if the clinical history is consistent with RLS.  Consider iron therapy and evaluation for iron deficiency anemia if the serum ferritin level < 50 ng/ml.  Certain medications or substances may aggravate RLS and common offenders may include the following:  nicotine, caffeine, SSRIs, TCAs, phenothiazine, dopamine antagonists, diphenhydramine, and alcohol.   7. A follow up appointment will be scheduled in the Sleep Clinic at [facility]. The referring provider will be notified of the results.      I certify that I have reviewed the entire raw data recording prior to the issuance of this report in accordance with the Standards of Accreditation of the American Academy of Sleep Medicine (AASM)    [name], MD  03-29-2017 Diplomat, American Board of Psychiatry and Neurology  Diplomat, American Board of [place] Director, [facility] [employer]

## 2017-03-29 NOTE — Telephone Encounter (Deleted)
Noted, will print this and give to [name].

## 2017-03-29 NOTE — Telephone Encounter (Signed)
He has severe PLMs, and supine dependent OSA. I lean towards not using CPAP after I rev- iewed his chart. If he can sleep prone or on is side, he wouldn't need CPAP.

## 2017-03-29 NOTE — Telephone Encounter (Signed)
Pt said he is going out of the country this Thursday 03/31/17. He said if the study has not been read by the time he leaves, he will check mychart.

## 2017-03-30 ENCOUNTER — Telehealth: Payer: Self-pay | Admitting: Neurology

## 2017-03-30 ENCOUNTER — Encounter: Payer: Self-pay | Admitting: Neurology

## 2017-03-30 NOTE — Telephone Encounter (Signed)
I called pt. I advised him that [name] found severe PLMs, and supine dependent but she recommends avoiding supine sleep, rather than proceeding with a cpap titration. Pt says that he received a mychart message from [name] last night that said there was no REM sleep captured during his sleep study but he sees on the sleep study on mychart that there was REM sleep captured. Pt is concerned that this is not accurate. Pt also wants to discuss medications that could contribute to the PLMS seen. I offered pt make pt an appt to discuss with [name] but he is going out of the country tomorrow and will not be available. He was agreeable to scheduling a follow up for 07/20/17 at 10:30am but still wants [name] to discuss why her message is different from the data regarding the REM sleep.

## 2017-03-30 NOTE — Telephone Encounter (Signed)
I spoke with Mr. follow by phone today, there has been an error in his sleep study interpretation he did have REM sleep but no REM dependent apnea and no REM sustained. Limb movements. These findings rule out REM behavior disorder. Periodic limb movements were still seen and very frequent and lead to arousals and sleep fragmentation. I suspect that fluoxetine could be a culprit and promised to contact Dr. Toy Care before the August revisit date to discuss the medication could be exchanged for another one and if she would approve of adding a dopaminergic agonist his current medication.

## 2017-04-08 NOTE — Progress Notes (Signed)
Can I copy new report in ?

## 2017-07-20 ENCOUNTER — Encounter: Payer: Self-pay | Admitting: Neurology

## 2017-07-20 ENCOUNTER — Ambulatory Visit (INDEPENDENT_AMBULATORY_CARE_PROVIDER_SITE_OTHER): Payer: 59 | Admitting: Neurology

## 2017-07-20 VITALS — BP 117/74 | HR 70 | Ht 72.0 in | Wt 205.0 lb

## 2017-07-20 DIAGNOSIS — G4761 Periodic limb movement disorder: Secondary | ICD-10-CM | POA: Diagnosis not present

## 2017-07-20 DIAGNOSIS — N3941 Urge incontinence: Secondary | ICD-10-CM | POA: Insufficient documentation

## 2017-07-20 DIAGNOSIS — R351 Nocturia: Secondary | ICD-10-CM

## 2017-07-20 DIAGNOSIS — Z981 Arthrodesis status: Secondary | ICD-10-CM | POA: Diagnosis not present

## 2017-07-20 NOTE — Patient Instructions (Signed)
Please remember to try to maintain good sleep hygiene, which means: Keep a regular sleep and wake schedule, try not to exercise or have a meal within 2 hours of your bedtime, try to keep your bedroom conducive for sleep, that is, cool and dark, without light distractors such as an illuminated alarm clock, and refrain from watching TV right before sleep or in the middle of the night and do not keep the TV or radio on during the night. Also, try not to use or play on electronic devices at bedtime, such as your cell phone, tablet PC or laptop. If you like to read at bedtime on an electronic device, try to dim the background light as much as possible. Do not eat in the middle of the night.     

## 2017-07-20 NOTE — Progress Notes (Signed)
[facility]   Provider:  [name], M D  Referring Provider: Darcus Austin, MD Primary Care Physician:  Darcus Austin, MD  Chief Complaint  Patient presents with  . Follow-up    HPI:  Caleb Hoffman is a 66 y.o. male , seen here as a referral/ revisit  from Dr. Toy Care. [name] is a former patient of [name].   I am meeting [name] today on 07/20/2017, the patient underwent a polysomnography on 03/23/2017 he had mild sleep apnea, REM dependent sleep was not noted- but he had significant, frequent periodic limb movements some was myoclonic jerking. Overall he had more arousals from limb movements then through apnea. He also experiences nocturia up to 4 times at night, further fragmenting his sleep. I recommended that we try to treat the periodic limb movements with the medication and then should see if we have a possibility to improve the rather mild obstructive sleep apnea. Sleep apnea was really dependent on his sleep position, the supine AHI was 37.9 per hour versus nonsupine AHI of 1.9. If the patient can avoid sleeping on the back I felt that CPAP would not be necessary. He is status post cervical anterior fusion in 2013, he reported C 3-4-5-6 fusion. He has not checked ferritin, he does not consume caffeine.     Consultation:  [name] reports EDS- excessive daytime sleepiness.  He usually can go to sleep and stay asleep for the night, but his spouse has noted that he is restless and sometimes twitching. He will have one or 2 bathroom breaks at night, and according to his wife he snoring, too. He has sometimes woken himself by snoring. What is more concerning is his daytime degree of sleepiness. He struggles to stay awake when he is not physically active or mentally stimulated. This affects also his ability to stay alert at work. He has fallen asleep. It can be before and after lunch. More often in the afternoons. He reported the sleepiness to Dr. Toy Care who placed a  request for a sleep study in consult on 01/05/2079. He has known his psychiatrist for the last 7 years and apparently this was not always a concern and is rather new. He does not report dream activity when falling asleep in daytime but he reports vivid dreams at night.  Sleep habits are as follows: He usually watches TV or reads before he goes to sleep and his time to go to the bedroom is about 11 PM. Does not consume  alcohol in the evening hours, only on week end dinner time .The bedroom is comfortable, cool, dark and quiet and he shares a bedroom with his wife. He prefers to sleep on his side and usually uses one pillow only.  Once in bed he goes to sleep within 10-15 minutes. He will wake up between 12:30 and 1 AM and again between 3:30 and 4 AM to go to the bathroom. He will have dreams, some very vivid and scary ones, and if he wakes he knows that these were not real. He can continue the same dream, in "installments". His alarm clock is set for 6:30, and he will rise at that time to go to work. He often wishes he could stay a little longer in bed but he has no trouble rising and starting his day.   Sleep medical history and family sleep history:  Younger brother has OSA, sleeps in a chair, is super obese and has EDS. Between the ages of  10 and 14  the patient  Experienced night terrors, but not necessarily sleepwalking  and not enuresis.  He has had nasal septum surgery in  1990 , anterior neck fusion in  8/ 2003. Dr. Phylliss Bob at [place].  Retired Nature conservation officer, diagnosed with PTSD, [employer] over 20 years. The event of PTSD was over 30 years ago.  Social history: Married, 2 children, daughters , retired Social research officer, government , Actor. He drinks 3-4 coffees per week and usually in the morning hours. He does not consume iced tea or soda. Non smoker.    Review of Systems: Out of a complete 14 system review, the patient complains of only the following symptoms, and all other reviewed  systems are negative. Snoring, EDS   Epworth score 12  , Fatigue severity score 24   , depression score 2/15  How likely are you to doze in the following situations: 0 = not likely, 1 = slight chance, 2 = moderate chance, 3 = high chance  Sitting and Reading? 2 Watching Television?2 Sitting inactive in a public place (theater or meeting)?1 As a passenger in a car for an hour without a break?1 Lying down in the afternoon when circumstances permit?3 Sitting and talking to someone?1 Sitting quietly after lunch without alcohol?2 In a car, while stopped for a few minutes in traffic?0   Total = 12 , FSS 28, depression score 2/ 15     Social History   Social History  . Marital status: Married    Spouse name: N/A  . Number of children: N/A  . Years of education: N/A   Occupational History  . Not on file.   Social History Main Topics  . Smoking status: Never Smoker  . Smokeless tobacco: Never Used  . Alcohol use Not on file  . Drug use: Unknown  . Sexual activity: Not on file   Other Topics Concern  . Not on file   Social History Narrative  . No narrative on file    No family history on file.  Past Medical History:  Diagnosis Date  . Anxiety   . Hypertension 2010    Past Surgical History:  Procedure Laterality Date  . ANTERIOR CERVICAL DECOMP/DISCECTOMY FUSION  07/13/2012   Procedure: ANTERIOR CERVICAL DECOMPRESSION/DISCECTOMY FUSION 3 LEVELS;  Surgeon: Sinclair Ship, MD;  Location: [facility];  Service: Orthopedics;  Laterality: Right;  Anterior cervical decompression fusion, cervical 4-5, cervical 5-6, cervical 6-7 with instrumentation, allograft.  [name] JOINT REPLACEMENT     Left Knee x2.    Current Outpatient Prescriptions  Medication Sig Dispense Refill  . amLODipine (NORVASC) 5 MG tablet Take 10 mg by mouth daily.     [name] aspirin EC 81 MG tablet Take 81 mg by mouth daily.    . Fluvoxamine Maleate 150 MG CP24 Take 150 mg by mouth 2 (two) times daily.    [name]  lisinopril-hydrochlorothiazide (PRINZIDE,ZESTORETIC) 20-12.5 MG per tablet Take 1 tablet by mouth daily.    [name] LORazepam (ATIVAN) 0.5 MG tablet Take 0.5 mg by mouth daily.    . Misc Natural Products (TART CHERRY ADVANCED) CAPS Take 2 capsules by mouth daily.    . Multiple Vitamin (MULTIVITAMIN WITH MINERALS) TABS Take 1 tablet by mouth daily.     No current facility-administered medications for this visit.     Allergies as of 07/20/2017  . (No Known Allergies)    Vitals: BP 117/74   Pulse 70   Ht 6' (1.829 m)   [employer]  205 lb (93 kg)   BMI 27.80 kg/m  Last Weight:  Wt Readings from Last 1 Encounters:  07/20/17 205 lb (93 kg)   KVQ:QVZD mass index is 27.8 kg/m.     Last Height:   Ht Readings from Last 1 Encounters:  07/20/17 6' (1.829 m)    Physical exam:  General: The patient is awake, alert and appears not in acute distress. The patient is well groomed. Head: Normocephalic, atraumatic. Neck is supple. Mallampati 3 ,  neck circumference: 17.5 inches . Nasal airflow patent , TMJ is evident. Retrognathia is seen.  Cardiovascular:  Regular rate and rhythm , without  murmurs or carotid bruit, and without distended neck veins. Respiratory: Lungs are clear to auscultation. Skin:  Without evidence of edema, or rash Trunk: BMI is 28. The patient's posture is erect.  Neurologic exam : The patient is awake and alert, oriented to place and time.   Attention span & concentration ability appears normal.  Speech is fluent,  without dysarthria, dysphonia or aphasia.  Mood and affect are appropriate.  Cranial nerves: Pupils are equal and briskly reactive to light.  Funduscopic exam without evidence of pallor or edema.  Extraocular movements  in vertical and horizontal planes intact and without nystagmus. Visual fields by finger perimetry are intact. Hearing to finger rub intact.  Facial sensation intact to fine touch. Facial motor strength is symmetric and tongue and uvula move midline.  Shoulder shrug was symmetrical.   Motor exam:  Normal tone, muscle bulk and symmetric strength in all extremities. No atrophy.  Sensory:  Fine touch, pinprick and vibration,all normal. Numbness in right index finger, often painfully. No longer dermatomal pain.  Coordination: Finger-to-nose maneuver without evidence of ataxia, dysmetria or tremor. Gait and station: Patient walks without assistive device. Deep tendon reflexes: in the  upper and lower extremities are symmetric and intact. Babinski maneuver response is downgoing.  The patient was advised of the nature of the diagnosed sleep disorder , the treatment options and risks for general a health and wellness arising from not treating the condition.  I spent more than 35 minutes of face to face time with the patient. Greater than 50% of time was spent in counseling and coordination of care. We have discussed the diagnosis and differential and I answered the patient's questions.     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   2) The severe PLMs which  Were affecting his sleep , causing multiple arousals. I will refer to his neck surgeon, Orthopedist Dr. Joan Flores. I will order ferritin, TIBC and a MRI neck- He reports urge incontinence - this can be related to spinal cord changes.   3) nocturia , frequent arousals from this. He reduced his fluid intake.   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 This can be a PTSD residual.   He does not have comorbidities of the cardiovascular system, no history of strokes, atrial fibrillation, presyncope, vertigo or  sleep related pain and headaches.- PLMs were unrelated to REM.  He       Plan:  Treatment plan and additional workup : Parasomnia montage  PSG ;    [employer] and [facility] .  Rv after sleep test.      [name] MD  07/20/2017   CC: Dr Toy Care, MD

## 2017-07-21 ENCOUNTER — Telehealth: Payer: Self-pay | Admitting: Neurology

## 2017-07-21 LAB — COMPREHENSIVE METABOLIC PANEL
ALBUMIN: 4.4 g/dL (ref 3.6–4.8)
ALT: 22 IU/L (ref 0–44)
AST: 22 IU/L (ref 0–40)
Albumin/Globulin Ratio: 1.6 (ref 1.2–2.2)
Alkaline Phosphatase: 74 IU/L (ref 39–117)
BUN/Creatinine Ratio: 21 (ref 10–24)
BUN: 21 mg/dL (ref 8–27)
Bilirubin Total: 0.2 mg/dL (ref 0.0–1.2)
CO2: 23 mmol/L (ref 20–29)
Calcium: 9.6 mg/dL (ref 8.6–10.2)
Chloride: 101 mmol/L (ref 96–106)
Creatinine, Ser: 1.02 mg/dL (ref 0.76–1.27)
GFR calc Af Amer: 89 mL/min/{1.73_m2} (ref >59)
GFR, EST NON AFRICAN AMERICAN: 77 mL/min/{1.73_m2} (ref >59)
GLOBULIN, TOTAL: 2.7 g/dL (ref 1.5–4.5)
Glucose: 92 mg/dL (ref 65–99)
Potassium: 4.3 mmol/L (ref 3.5–5.2)
SODIUM: 140 mmol/L (ref 134–144)
Total Protein: 7.1 g/dL (ref 6.0–8.5)

## 2017-07-21 LAB — IRON AND TIBC
IRON: 120 ug/dL (ref 38–169)
Iron Saturation: 36 % (ref 15–55)
TIBC: 332 ug/dL (ref 250–450)
UIBC: 212 ug/dL (ref 111–343)

## 2017-07-21 LAB — FERRITIN: Ferritin: 79 ng/mL (ref 30–400)

## 2017-07-21 NOTE — Telephone Encounter (Signed)
-----   Message from [name], MD sent at 07/21/2017  8:41 AM EDT ----- Normal Iron levels and normal metabolic panel- iron deficiency was not cause for RLS . CD

## 2017-07-21 NOTE — Telephone Encounter (Signed)
Called and discussed the normal lab values. Pt is awaiting the MRI to get scheduled and then we will figure out how to proceed forward after that. Pt had no further questions and verbalized understanding.

## 2017-08-13 ENCOUNTER — Ambulatory Visit
Admission: RE | Admit: 2017-08-13 | Discharge: 2017-08-13 | Disposition: A | Discharge status: Home / Self Care | Source: Ambulatory Visit | Attending: Neurology | Admitting: Neurology

## 2017-08-13 DIAGNOSIS — R351 Nocturia: Secondary | ICD-10-CM

## 2017-08-13 DIAGNOSIS — G4761 Periodic limb movement disorder: Secondary | ICD-10-CM

## 2017-08-13 DIAGNOSIS — N3941 Urge incontinence: Secondary | ICD-10-CM | POA: Diagnosis not present

## 2017-08-13 DIAGNOSIS — Z981 Arthrodesis status: Secondary | ICD-10-CM | POA: Diagnosis not present

## 2017-08-13 MED ORDER — GADOBENATE DIMEGLUMINE 529 MG/ML IV SOLN
18.0000 mL | Freq: Once | INTRAVENOUS | Status: AC | PRN
  Administered 2017-08-13: 18 mL via INTRAVENOUS

## 2017-08-17 ENCOUNTER — Telehealth: Payer: Self-pay | Admitting: Neurology

## 2017-08-17 NOTE — Telephone Encounter (Signed)
Attempted call to patient but there was no answer and was unable to leave a voicemail.

## 2017-08-17 NOTE — Telephone Encounter (Signed)
-----   Message from [name], MD sent at 08/15/2017  4:41 PM EDT ----- Very improved (post operative) spinal stenosis- no cord compression, very little DDD. No abnormal finding of myelopathy ( MRI findings  not explaining incontinence). Will need to look at lumbar spine next.

## 2017-08-23 ENCOUNTER — Telehealth: Payer: Self-pay | Admitting: Neurology

## 2017-08-23 NOTE — Telephone Encounter (Signed)
Called patient to discuss MRI results. No answer. LVM for patient to call back

## 2017-08-23 NOTE — Telephone Encounter (Signed)
-----   Message from [name], MD sent at 08/15/2017  4:41 PM EDT ----- Very improved (post operative) spinal stenosis- no cord compression, very little DDD. No abnormal finding of myelopathy ( MRI findings  not explaining incontinence). Will need to look at lumbar spine next.

## 2017-08-29 NOTE — Progress Notes (Signed)
[facility]  PATIENT: Caleb Hoffman DOB: Dec 23, 1950   REASON FOR VISIT: Follow-up for  sleep apnea, restless leg syndrome, HISTORY FROM:    HISTORY OF PRESENT ILLNESS:UPDATE 10/16/2018CM  Caleb Hoffman, 66 year old male returns for follow-up with a history of mild sleep apnea, REM sleep was not noted that he has frequent periodic limb movements some were myoclonic jerking.Overall he had more arousals from limb movements then through apnea. He also complains with  nocturia up to 2 times at night.  This  has reduced once he reduced his fluid intake at night.  Ferritin and iron studies returned normal. He has a history of C3-4-5 6 fusion.What is more concerning is his daytime degree of sleepiness.  He reports today that he can fall asleep at his desk for 10-20 minutes. When he wakes up there is a variety of colors on the wall that eventually go away.He struggles to stay awake when he is not physically active or mentally stimulated.  MRI of the cervical spine improved postoperative no cord compression. Little DDD and findings of myelopathy. He has  urinary urgency but no incontinence. He returns for reevaluation   07/20/17 CDJeffrey W Stefanski is a 66 y.o. male , seen here as a referral/ revisit  from Dr. Toy Care. Caleb Hoffman is a former patient of [name].  I am meeting Caleb Hoffman today on 07/20/2017, the patient underwent a polysomnography on 03/23/2017 he had mild sleep apnea, REM dependent sleep was not noted- but he had significant, frequent periodic limb movements some was myoclonic jerking. Overall he had more arousals from limb movements then through apnea. He also experiences nocturia up to 4 times at night, further fragmenting his sleep. I recommended that we try to treat the periodic limb movements with the medication and then should see if we have a possibility to improve the rather mild obstructive sleep apnea. Sleep apnea was really dependent on his sleep position, the supine  AHI was 37.9 per hour versus nonsupine AHI of 1.9. If the patient can avoid sleeping on the back I felt that CPAP would not be necessary. He is status post cervical anterior fusion in 2013, he reported C 3-4-5-6 fusion. He has not checked ferritin, he does not consume caffeine.     Consultation:  Caleb Hoffman reports EDS- excessive daytime sleepiness.  He usually can go to sleep and stay asleep for the night, but his spouse has noted that he is restless and sometimes twitching. He will have one or 2 bathroom breaks at night, and according to his wife he snoring, too. He has sometimes woken himself by snoring. What is more concerning is his daytime degree of sleepiness. He struggles to stay awake when he is not physically active or mentally stimulated. This affects also his ability to stay alert at work. He has fallen asleep. It can be before and after lunch. More often in the afternoons. He reported the sleepiness to Dr. Toy Care who placed a request for a sleep study in consult on 01/05/2079. He has known his psychiatrist for the last 7 years and apparently this was not always a concern and is rather new. He does not report dream activity when falling asleep in daytime but he reports vivid dreams at night.    REVIEW OF SYSTEMS: Full 14 system review of systems performed and notable only for those listed, all others are neg:  Constitutional: neg  Cardiovascular: neg Ear/Nose/Throat: neg  Skin: neg Eyes: neg Respiratory: neg Gastroitestinal:  Urinary  frequency  Hematology/Lymphatic: neg  Endocrine: neg Musculoskeletal:neg Allergy/Immunology: neg Neurological: neg Psychiatric:  anxiety Sleep :  Daytime sleepiness   ALLERGIES: No Known Allergies  HOME MEDICATIONS: Outpatient Medications Prior to Visit  Medication Sig Dispense Refill  . amLODipine (NORVASC) 5 MG tablet Take 10 mg by mouth daily.     [name] aspirin EC 81 MG tablet Take 81 mg by mouth daily.    . Fluvoxamine Maleate 150 MG  CP24 Take 150 mg by mouth 2 (two) times daily.    [name] lisinopril-hydrochlorothiazide (PRINZIDE,ZESTORETIC) 20-12.5 MG per tablet Take 1 tablet by mouth daily.    [name] LORazepam (ATIVAN) 0.5 MG tablet Take 0.5 mg by mouth daily.    . Misc Natural Products (TART CHERRY ADVANCED) CAPS Take 2 capsules by mouth daily.    . Multiple Vitamin (MULTIVITAMIN WITH MINERALS) TABS Take 1 tablet by mouth daily.     No facility-administered medications prior to visit.     PAST MEDICAL HISTORY: Past Medical History:  Diagnosis Date  . Anxiety   . Hypertension 2010    PAST SURGICAL HISTORY: Past Surgical History:  Procedure Laterality Date  . ANTERIOR CERVICAL DECOMP/DISCECTOMY FUSION  07/13/2012   Procedure: ANTERIOR CERVICAL DECOMPRESSION/DISCECTOMY FUSION 3 LEVELS;  Surgeon: Sinclair Ship, MD;  Location: [facility];  Service: Orthopedics;  Laterality: Right;  Anterior cervical decompression fusion, cervical 4-5, cervical 5-6, cervical 6-7 with instrumentation, allograft.  [name] JOINT REPLACEMENT     Left Knee x2.    FAMILY HISTORY: No family history on file.  SOCIAL HISTORY: Social History   Social History  . Marital status: Married    Spouse name: N/A  . Number of children: N/A  . Years of education: N/A   Occupational History  . Not on file.   Social History Main Topics  . Smoking status: Never Smoker  . Smokeless tobacco: Never Used  . Alcohol use Not on file  . Drug use: Unknown  . Sexual activity: Not on file   Other Topics Concern  . Not on file   Social History Narrative  . No narrative on file     PHYSICAL EXAM  Vitals:   08/30/17 1316  BP: 113/72  Pulse: 62  Weight: 205 lb 9.6 oz (93.3 kg)  Height: 6' (1.829 m)   Body mass index is 27.88 kg/m.  Generalized: Well developed, in no acute distress  Head: normocephalic and atraumatic,. Oropharynx benign  Neck: Supple, no carotid bruits  Cardiac: Regular rate rhythm, no murmur  Musculoskeletal: No deformity    Neurological examination   Mentation: Alert oriented to time, place, history taking. Attention span and concentration appropriate. Recent and remote memory intact.  Follows all commands speech and language fluent.   Cranial nerve II-XII: Fundoscopic exam reveals sharp disc margins.Pupils were equal round reactive to light extraocular movements were full, visual field were full on confrontational test. Facial sensation and strength were normal. hearing was intact to finger rubbing bilaterally. Uvula tongue midline. head turning and shoulder shrug were normal and symmetric.Tongue protrusion into cheek strength was normal. Motor: normal bulk and tone, full strength in the BUE, BLE, fine finger movements normal, no pronator drift. No focal weakness Sensory: normal and symmetric to light touch, pinprick, and  Vibration,in the upper and lower extremities Coordination: finger-nose-finger, heel-to-shin bilaterally, no dysmetria Reflexes: Brachioradialis 2/2, biceps 2/2, triceps 2/2, patellar 2/2, Achilles 2/2, plantar responses were flexor bilaterally. Gait and Station: Rising up from seated position without assistance, normal stance,  moderate stride, good  arm swing, smooth turning, able to perform tiptoe, and heel walking without difficulty. Tandem gait is steady  DIAGNOSTIC DATA (LABS, IMAGING, TESTING) - I reviewed patient records, labs, notes, testing and imaging myself where available.      Component Value Date/Time   NA 140 07/20/2017 1117   K 4.3 07/20/2017 1117   CL 101 07/20/2017 1117   CO2 23 07/20/2017 1117   GLUCOSE 92 07/20/2017 1117   GLUCOSE 96 07/10/2012 0855   BUN 21 07/20/2017 1117   CREATININE 1.02 07/20/2017 1117   CALCIUM 9.6 07/20/2017 1117   PROT 7.1 07/20/2017 1117   ALBUMIN 4.4 07/20/2017 1117   AST 22 07/20/2017 1117   ALT 22 07/20/2017 1117   ALKPHOS 74 07/20/2017 1117   BILITOT 0.2 07/20/2017 1117   GFRNONAA 77 07/20/2017 1117   GFRAA 89 07/20/2017 1117     ASSESSMENT AND PLAN  66 y.o. year old male  has a past medical history of Anxiety and Hypertension (2010). here to follow up for positional dependent apnea, patient is using a tennis ball. He has severe PLMS.He has nocturia twice at night better since reducing fluids.He reports today that he can fall asleep at his desk for 10-20 minutes. When he wakes up there is a variety of colors on the wall that eventually go away.He struggles to stay awake when he is not physically active or mentally stimulated. .    Discussed with [name] Will check prolonged EEG for seizure activity Follow up with [name] 2  Months I spent 25 min  in total face to face time with the patient more than 50% of which was spent counseling and coordination of care, reviewing test results reviewing medications and discussing and reviewing the diagnosis of questionable seizure disorder, positional dependent apnea at nighttime nocturia no incontinence and answered  questions Caleb Bible, [facility], [facility], APRN  [facility] [address] [place], [place] [address] [phone]

## 2017-08-30 ENCOUNTER — Encounter: Payer: Self-pay | Admitting: Nurse Practitioner

## 2017-08-30 ENCOUNTER — Ambulatory Visit (INDEPENDENT_AMBULATORY_CARE_PROVIDER_SITE_OTHER): Payer: 59 | Admitting: Nurse Practitioner

## 2017-08-30 VITALS — BP 113/72 | HR 62 | Ht 72.0 in | Wt 205.6 lb

## 2017-08-30 DIAGNOSIS — R400 Somnolence: Secondary | ICD-10-CM | POA: Insufficient documentation

## 2017-08-30 DIAGNOSIS — G478 Other sleep disorders: Secondary | ICD-10-CM

## 2017-08-30 DIAGNOSIS — G4752 REM sleep behavior disorder: Secondary | ICD-10-CM | POA: Diagnosis not present

## 2017-08-30 DIAGNOSIS — G4761 Periodic limb movement disorder: Secondary | ICD-10-CM | POA: Diagnosis not present

## 2017-08-30 NOTE — Patient Instructions (Signed)
Will check prolonged EEG Follow up with [name] 2  months

## 2017-08-31 NOTE — Progress Notes (Signed)
I agree with the assessment and plan as directed by NP .The patient is known to me .   Hykeem Ojeda, MD  

## 2017-09-13 ENCOUNTER — Ambulatory Visit (INDEPENDENT_AMBULATORY_CARE_PROVIDER_SITE_OTHER): Payer: 59

## 2017-09-13 DIAGNOSIS — R259 Unspecified abnormal involuntary movements: Secondary | ICD-10-CM | POA: Diagnosis not present

## 2017-09-13 DIAGNOSIS — R400 Somnolence: Secondary | ICD-10-CM

## 2017-09-15 HISTORY — PX: OTHER SURGICAL HISTORY: SHX169

## 2017-09-15 HISTORY — PX: TOOTH EXTRACTION: SUR596

## 2017-09-20 NOTE — Procedures (Signed)
   [facility]  EEG (ELECTROENCEPHALOGRAM) REPORT   STUDY DATE: 09/13/17 PATIENT NAME: Caleb Hoffman DOB: 07-May-1951 MRN: [id number]  ORDERING CLINICIAN: [name], MD   TECHNOLOGIST: Laretta Alstrom  TECHNIQUE: Electroencephalogram was recorded utilizing standard 10-20 system of lead placement and reformatted into average and bipolar montages.  RECORDING TIME: 50 minutes ACTIVATION: hyperventilation and photic stimulation  CLINICAL INFORMATION: 66 year old male with abnormal muscle jerking movements and visual disturbance.  FINDINGS: Posterior dominant background rhythms, which attenuate with eye opening, ranging 9-11 hertz and 15-20 microvolts. No focal, lateralizing, epileptiform activity or seizures are seen. Patient recorded in the awake and drowsy state. EKG channel shows regular rhythm of 60 beats per minute.   IMPRESSION:   Normal EEG (50 minutes) in the awake and drowsy states.    INTERPRETING PHYSICIAN:  Penni Bombard, MD Certified in Neurology, Neurophysiology and Neuroimaging  [facility] [address], [place], [place] [address] [phone]

## 2017-09-21 ENCOUNTER — Telehealth: Payer: Self-pay | Admitting: *Deleted

## 2017-09-21 NOTE — Telephone Encounter (Signed)
LVM informing patient he had a normal EEG in the awake and sleep state. Advised he Keep follow-up appointment in Dec. for further testing. Left number for any questions.

## 2017-11-08 NOTE — Progress Notes (Signed)
[facility]  PATIENT: BRYSON GAVIA DOB: Oct 03, 1951   REASON FOR VISIT: Follow-up for  sleep apnea, restless leg syndrome, HISTORY FROM: Patient    HISTORY OF PRESENT ILLNESS:UPDATE 12/26/2018CM Mr. Andreoli, 66 year old male returns for follow-up with a history of mild sleep apnea.  REM sleep was noted to have frequent periodic limb movements with some myoclonic jerking overall he had more arousals from limb movements then free from apnea.  He reports that his wife says his movements are better.  He continues to get up at least once at night to urinate however he has fluids up till 10:00 at night.  When he reduces his fluid intake at night it is better he continues to report that he can fall asleep at his desk, when he wakes up there are a variety of colors on the wall that eventually go away.  This only happens at work it does not happen when he is at home.  He struggles to stay awake when he is not physically active or mentally stimulated.  He had prolonged EEG after his last visit for 50 minutes, testing which was normal.  He returns for reevaluation.  ESS score 14 fatigue severity scale 23.    UPDATE 10/16/2018CM  Mr. Sandoz, 67 year old male returns for follow-up with a history of mild sleep apnea, REM sleep was not noted that he has frequent periodic limb movements some were myoclonic jerking.Overall he had more arousals from limb movements then through apnea. He also complains with  nocturia up to 2 times at night.  This  has reduced once he reduced his fluid intake at night.  Ferritin and iron studies returned normal. He has a history of C3-4-5 6 fusion.What is more concerning is his daytime degree of sleepiness.  He reports today that he can fall asleep at his desk for 10-20 minutes. When he wakes up there is a variety of colors on the wall that eventually go away.He struggles to stay awake when he is not physically active or mentally stimulated.  MRI of the cervical  spine improved postoperative no cord compression. Little DDD and findings of myelopathy. He has  urinary urgency but no incontinence. He returns for reevaluation   07/20/17 CDJeffrey W Nardozzi is a 66 y.o. male , seen here as a referral/ revisit  from Dr. Toy Care. Mr. Burrows is a former patient of [name].  I am meeting Mr. Herrman today on 07/20/2017, the patient underwent a polysomnography on 03/23/2017 he had mild sleep apnea, REM dependent sleep was not noted- but he had significant, frequent periodic limb movements some was myoclonic jerking. Overall he had more arousals from limb movements then through apnea. He also experiences nocturia up to 4 times at night, further fragmenting his sleep. I recommended that we try to treat the periodic limb movements with the medication and then should see if we have a possibility to improve the rather mild obstructive sleep apnea. Sleep apnea was really dependent on his sleep position, the supine AHI was 37.9 per hour versus nonsupine AHI of 1.9. If the patient can avoid sleeping on the back I felt that CPAP would not be necessary. He is status post cervical anterior fusion in 2013, he reported C 3-4-5-6 fusion. He has not checked ferritin, he does not consume caffeine.     Consultation:  Mr. Litke reports EDS- excessive daytime sleepiness.  He usually can go to sleep and stay asleep for the night, but his spouse has noted that he  is restless and sometimes twitching. He will have one or 2 bathroom breaks at night, and according to his wife he snoring, too. He has sometimes woken himself by snoring. What is more concerning is his daytime degree of sleepiness. He struggles to stay awake when he is not physically active or mentally stimulated. This affects also his ability to stay alert at work. He has fallen asleep. It can be before and after lunch. More often in the afternoons. He reported the sleepiness to Dr. Toy Care who placed a request for a sleep study in consult  on 01/05/2079. He has known his psychiatrist for the last 7 years and apparently this was not always a concern and is rather new. He does not report dream activity when falling asleep in daytime but he reports vivid dreams at night.    REVIEW OF SYSTEMS: Full 14 system review of systems performed and notable only for those listed, all others are neg:  Constitutional: neg  Cardiovascular: neg Ear/Nose/Throat: neg  Skin: neg Eyes: neg Respiratory: neg Gastroitestinal:  Urinary frequency  Hematology/Lymphatic: neg  Endocrine: neg Musculoskeletal:neg Allergy/Immunology: neg Neurological: neg Psychiatric:  anxiety Sleep :  Daytime sleepiness   ALLERGIES: No Known Allergies  HOME MEDICATIONS: Outpatient Medications Prior to Visit  Medication Sig Dispense Refill  . amLODipine (NORVASC) 5 MG tablet Take 10 mg by mouth daily.     [name] aspirin EC 81 MG tablet Take 81 mg by mouth daily.    . Fluvoxamine Maleate 150 MG CP24 Take 150 mg by mouth 2 (two) times daily.    [name] lisinopril-hydrochlorothiazide (PRINZIDE,ZESTORETIC) 20-12.5 MG per tablet Take 1 tablet by mouth daily.    [name] LORazepam (ATIVAN) 0.5 MG tablet Take 0.5 mg by mouth daily.    . Misc Natural Products (TART CHERRY ADVANCED) CAPS Take 2 capsules by mouth daily.    . Multiple Vitamin (MULTIVITAMIN WITH MINERALS) TABS Take 1 tablet by mouth daily.     No facility-administered medications prior to visit.     PAST MEDICAL HISTORY: Past Medical History:  Diagnosis Date  . Anxiety   . Hypertension 2010    PAST SURGICAL HISTORY: Past Surgical History:  Procedure Laterality Date  . ANTERIOR CERVICAL DECOMP/DISCECTOMY FUSION  07/13/2012   Procedure: ANTERIOR CERVICAL DECOMPRESSION/DISCECTOMY FUSION 3 LEVELS;  Surgeon: Sinclair Ship, MD;  Location: [facility];  Service: Orthopedics;  Laterality: Right;  Anterior cervical decompression fusion, cervical 4-5, cervical 5-6, cervical 6-7 with instrumentation, allograft.  [name] JOINT  REPLACEMENT     Left Knee x2.  . tooth extraction  09/2017    FAMILY HISTORY: History reviewed. No pertinent family history.  SOCIAL HISTORY: Social History   Socioeconomic History  . Marital status: Married    Spouse name: Not on file  . Number of children: Not on file  . Years of education: Not on file  . Highest education level: Not on file  Social Needs  . Financial resource strain: Not on file  . Food insecurity - worry: Not on file  . Food insecurity - inability: Not on file  . Transportation needs - medical: Not on file  . Transportation needs - non-medical: Not on file  Occupational History  . Not on file  Tobacco Use  . Smoking status: Never Smoker  . Smokeless tobacco: Never Used  Substance and Sexual Activity  . Alcohol use: Not on file  . Drug use: Not on file  . Sexual activity: Not on file  Other Topics Concern  . Not  on file  Social History Narrative  . Not on file     PHYSICAL EXAM  Vitals:   11/09/17 1308  BP: 125/68  Pulse: 68  Weight: 201 lb 9.6 oz (91.4 kg)  Height: 6' (1.829 m)   Body mass index is 27.34 kg/m.  Generalized: Well developed, in no acute distress  Head: normocephalic and atraumatic,. Oropharynx benign  Neck: Supple, no carotid bruits  Cardiac: Regular rate rhythm, no murmur  Musculoskeletal: No deformity   Neurological examination   Mentation: Alert oriented to time, place, history taking. Attention span and concentration appropriate. Recent and remote memory intact.  Follows all commands speech and language fluent. ESS 14 FSS 23  Cranial nerve II-XII: Pupils were equal round reactive to light extraocular movements were full, visual field were full on confrontational test. Facial sensation and strength were normal. hearing was intact to finger rubbing bilaterally. Uvula tongue midline. head turning and shoulder shrug were normal and symmetric.Tongue protrusion into cheek strength was normal. Motor: normal bulk and tone,  full strength in the BUE, BLE, fine finger movements normal, no pronator drift. No focal weakness Sensory: normal and symmetric to light touch, pinprick, and  Vibration,in the upper and lower extremities Coordination: finger-nose-finger, heel-to-shin bilaterally, no dysmetria Reflexes: Brachioradialis 2/2, biceps 2/2, triceps 2/2, patellar 2/2, Achilles 2/2, plantar responses were flexor bilaterally. Gait and Station: Rising up from seated position without assistance, normal stance,  moderate stride, good arm swing, smooth turning, able to perform tiptoe, and heel walking without difficulty. Tandem gait is steady  DIAGNOSTIC DATA (LABS, IMAGING, TESTING) - I reviewed patient records, labs, notes, testing and imaging myself where available.      Component Value Date/Time   NA 140 07/20/2017 1117   K 4.3 07/20/2017 1117   CL 101 07/20/2017 1117   CO2 23 07/20/2017 1117   GLUCOSE 92 07/20/2017 1117   GLUCOSE 96 07/10/2012 0855   BUN 21 07/20/2017 1117   CREATININE 1.02 07/20/2017 1117   CALCIUM 9.6 07/20/2017 1117   PROT 7.1 07/20/2017 1117   ALBUMIN 4.4 07/20/2017 1117   AST 22 07/20/2017 1117   ALT 22 07/20/2017 1117   ALKPHOS 74 07/20/2017 1117   BILITOT 0.2 07/20/2017 1117   GFRNONAA 77 07/20/2017 1117   GFRAA 89 07/20/2017 1117    ASSESSMENT AND PLAN  66 y.o. year old male  has a past medical history of Anxiety and Hypertension (2010). here to follow up for positional dependent apnea, patient is using a tennis ball. He has severe PLMS although his wife reports his movements have lessened..He has nocturia once or twice at night better when reducing fluids.He continues to report today that he can fall asleep at his desk for 10-20 minutes. When he wakes up there is a variety of colors on the wall that eventually go away.He struggles to stay awake when he is not physically active or mentally stimulated.  EEG performed for 50 minutes was normal.  Will get prolonged 72-hour video EEG. .     Discussed with [name] Will check prolonged EEG for seizure activity 72 hour Follow up with [name] 3  Months I spent 20 min  in total face to face time with the patient more than 50% of which was spent counseling and coordination of care, reviewing test results reviewing medications and discussing and reviewing the diagnosis of questionable seizure disorder, positional dependent apnea at nighttime nocturia no incontinence and answered  questions Dennie Bible, [facility], [facility], APRN  [facility] [address] [place], [place] [address] [phone]

## 2017-11-09 ENCOUNTER — Encounter: Payer: Self-pay | Admitting: *Deleted

## 2017-11-09 ENCOUNTER — Ambulatory Visit (INDEPENDENT_AMBULATORY_CARE_PROVIDER_SITE_OTHER): Payer: 59 | Admitting: Nurse Practitioner

## 2017-11-09 ENCOUNTER — Encounter: Payer: Self-pay | Admitting: Nurse Practitioner

## 2017-11-09 ENCOUNTER — Telehealth: Payer: Self-pay | Admitting: *Deleted

## 2017-11-09 VITALS — BP 125/68 | HR 68 | Ht 72.0 in | Wt 201.6 lb

## 2017-11-09 DIAGNOSIS — G4752 REM sleep behavior disorder: Secondary | ICD-10-CM | POA: Diagnosis not present

## 2017-11-09 DIAGNOSIS — G4761 Periodic limb movement disorder: Secondary | ICD-10-CM | POA: Diagnosis not present

## 2017-11-09 DIAGNOSIS — H539 Unspecified visual disturbance: Secondary | ICD-10-CM | POA: Diagnosis not present

## 2017-11-09 DIAGNOSIS — R400 Somnolence: Secondary | ICD-10-CM

## 2017-11-09 DIAGNOSIS — G478 Other sleep disorders: Secondary | ICD-10-CM

## 2017-11-09 NOTE — Patient Instructions (Signed)
Will check prolonged EEG for seizure activity 72 hour Follow up with [name] 3  Months

## 2017-11-09 NOTE — Telephone Encounter (Signed)
Called pt and offered appt for tomorrow with [name] at 57, but he was not able as had to go back to work.   He will keep appt today with CM/NP as scheduled.  Normal EEG done, normal.

## 2017-11-09 NOTE — Telephone Encounter (Signed)
We will order 72-hour video EEG

## 2017-11-10 NOTE — Progress Notes (Signed)
Referral received at [facility]. 11-10-17 per fax.

## 2017-12-12 ENCOUNTER — Telehealth: Payer: Self-pay | Admitting: Nurse Practitioner

## 2017-12-12 NOTE — Telephone Encounter (Signed)
Pt is adjusting his medication through Dr. Toy Care and is wanting to see how it goes before doing the 3 day EEG

## 2017-12-12 NOTE — Telephone Encounter (Signed)
Spoke with patient who, per Dr Toy Care has adjusted times of day he is taking fluvoxamine maleate to taking full dose at night time.  Patient would like to continue taking med at night time before doing 3 day EEG. He stated Dr Toy Care wanted him to take it at night time x 4 weeks then call her back. This RN advised will let NP know and requested he call this office back as well. He does not intend to schedule 3 day EEG at this time. Will route to Daun Peacock, NP.

## 2017-12-12 NOTE — Telephone Encounter (Signed)
noted 

## 2018-02-07 ENCOUNTER — Encounter: Payer: Self-pay | Admitting: Neurology

## 2018-02-07 ENCOUNTER — Ambulatory Visit (INDEPENDENT_AMBULATORY_CARE_PROVIDER_SITE_OTHER): Payer: 59 | Admitting: Neurology

## 2018-02-07 VITALS — BP 125/73 | HR 70 | Ht 72.0 in | Wt 208.0 lb

## 2018-02-07 DIAGNOSIS — G4733 Obstructive sleep apnea (adult) (pediatric): Secondary | ICD-10-CM | POA: Diagnosis not present

## 2018-02-07 NOTE — Progress Notes (Signed)
[facility]  PATIENT: Caleb Hoffman DOB: 1951-08-18   REASON FOR VISIT: Follow-up for sleep apnea, restless leg syndrome, HISTORY FROM: Patient alone .     HISTORY OF PRESENT ILLNESS:  UPDATE from 02-07-2018,   I have the pleasure of meeting today with Mr. Caleb Hoffman, who has been treated for obstructive sleep apnea, restless legs and has been doing quite well.  Dr. Toy Care,  his psychiatrist , adjusted some medication namely for Fluvoxamine  and he is taking the dose now at night instead of twice in AM.  This patient tried a dental device for OSA, which didn't work.  He had such mild apnea , he can forgo CPAP. He has to sleep in  non-supine - AHI was almost 0.0 whole using his " tennis ball shirt". He has been doing well with RLS. He is less anxious.  He has still spells of visions of light, disorientation.   EEG was normal-  50 min, he could not get an ambulatory EEG due to work schedule.   [facility]  EEG (ELECTROENCEPHALOGRAM) REPORT  STUDY DATE: 09/13/17 PATIENT NAME: Caleb Hoffman DOB: 07-18-1951 MRN: [id number]  ORDERING CLINICIAN: [name], MD  TECHNOLOGIST: Laretta Alstrom  TECHNIQUE: Electroencephalogram was recorded utilizing standard 10-20 system of lead placement and reformatted into average and bipolar montages.  RECORDING TIME: 50 minutes ACTIVATION: hyperventilation and photic stimulation CLINICAL INFORMATION: 67 year old male with abnormal muscle jerking movements and visual disturbance.  FINDINGS: Posterior dominant background rhythms, which attenuate with eye opening, ranging 9-11 hertz and 15-20 microvolts. No focal, lateralizing, epileptiform activity or seizures are seen. Patient recorded in the awake and drowsy state. EKG channel shows regular rhythm of 60 beats per minute.  IMPRESSION: Normal EEG (50 minutes) in the awake and drowsy states.  INTERPRETING PHYSICIAN:  Penni Bombard,  MD    11/09/2017 CM. Mr. Caleb Hoffman, 67 year old male returns for follow-up with a history of mild sleep apnea.  REM sleep was noted to have frequent periodic limb movements with some myoclonic jerking overall he had more arousals from limb movements then free from apnea.   He reports that his wife says his movements are better.  He continues to get up at least once at night to urinate however he has fluids up till 10:00 at night.  When he reduces his fluid intake at night it is better he continues to report that he can fall asleep at his desk, when he wakes up there are a variety of colors on the wall that eventually go away.  This only happens at work it does not happen when he is at home.  He struggles to stay awake when he is not physically active or mentally stimulated.  He had prolonged EEG after his last visit for 50 minutes, testing which was normal.  He returns for reevaluation.  ESS score 14 fatigue severity scale 23.    UPDATE 10/16/2018CM  Mr. Caleb Hoffman, 67 year old male returns for follow-up with a history of mild sleep apnea, REM sleep was not noted that he has frequent periodic limb movements some were myoclonic jerking.Overall he had more arousals from limb movements then through apnea. He also complains with  nocturia up to 2 times at night.  This  has reduced once he reduced his fluid intake at night.  Ferritin and iron studies returned normal. He has a history of C3-4-5 6 fusion.What is more concerning is his daytime degree of sleepiness.  He reports today that he can  fall asleep at his desk for 10-20 minutes. When he wakes up there is a variety of colors on the wall that eventually go away.He struggles to stay awake when he is not physically active or mentally stimulated.  MRI of the cervical spine improved postoperative no cord compression. Little DDD and findings of myelopathy. He has  urinary urgency but no incontinence. He returns for reevaluation   07/20/17 CDJeffrey W Hoffman is a 67 y.o.  male , seen here as a referral/ revisit  from Dr. Toy Care. Mr. Caleb Hoffman is a former patient of [name].  I am meeting Mr. Caleb Hoffman today on 07/20/2017, the patient underwent a polysomnography on 03/23/2017 he had mild sleep apnea, REM dependent sleep was not noted- but he had significant, frequent periodic limb movements some was myoclonic jerking. Overall he had more arousals from limb movements then through apnea. He also experiences nocturia up to 4 times at night, further fragmenting his sleep. I recommended that we try to treat the periodic limb movements with the medication and then should see if we have a possibility to improve the rather mild obstructive sleep apnea. Sleep apnea was really dependent on his sleep position, the supine AHI was 37.9 per hour versus nonsupine AHI of 1.9. If the patient can avoid sleeping on the back I felt that CPAP would not be necessary. He is status post cervical anterior fusion in 2013, he reported C 3-4-5-6 fusion. He has not checked ferritin, he does not consume caffeine.    Consultation:  Mr. Caleb Hoffman reports EDS- excessive daytime sleepiness.  He usually can go to sleep and stay asleep for the night, but his spouse has noted that he is restless and sometimes twitching. He will have one or 2 bathroom breaks at night, and according to his wife he snoring, too. He has sometimes woken himself by snoring. What is more concerning is his daytime degree of sleepiness. He struggles to stay awake when he is not physically active or mentally stimulated. This affects also his ability to stay alert at work. He has fallen asleep. It can be before and after lunch. More often in the afternoons. He reported the sleepiness to Dr. Toy Care who placed a request for a sleep study in consult on 01/05/2079. He has known his psychiatrist for the last 7 years and apparently this was not always a concern and is rather new. He does not report dream activity when falling asleep in daytime but he  reports vivid dreams at night.    REVIEW OF SYSTEMS: Full 14 system review of systems performed and notable only for those listed, all others are neg:  Gastroitestinal:  Urinary frequency  Psychiatric:  anxiety Sleep :  Daytime sleepiness, PLMs.    ALLERGIES: No Known Allergies  HOME MEDICATIONS: Outpatient Medications Prior to Visit  Medication Sig Dispense Refill  . amLODipine (NORVASC) 5 MG tablet Take 10 mg by mouth daily.     [name] aspirin EC 81 MG tablet Take 81 mg by mouth daily.    . Fluvoxamine Maleate 150 MG CP24 Take 150 mg by mouth 2 (two) times daily.    [name] lisinopril-hydrochlorothiazide (PRINZIDE,ZESTORETIC) 20-12.5 MG per tablet Take 1 tablet by mouth daily.    [name] LORazepam (ATIVAN) 0.5 MG tablet Take 0.5 mg by mouth daily.    . Misc Natural Products (TART CHERRY ADVANCED) CAPS Take 2 capsules by mouth daily.    . Multiple Vitamin (MULTIVITAMIN WITH MINERALS) TABS Take 1 tablet by mouth daily.    [name]  OVER THE COUNTER MEDICATION 2.5 mLs daily. Royal jelly powder     No facility-administered medications prior to visit.     PAST MEDICAL HISTORY: Past Medical History:  Diagnosis Date  . Anxiety   . Hypertension 2010    PAST SURGICAL HISTORY: Past Surgical History:  Procedure Laterality Date  . ANTERIOR CERVICAL DECOMP/DISCECTOMY FUSION  07/13/2012   Procedure: ANTERIOR CERVICAL DECOMPRESSION/DISCECTOMY FUSION 3 LEVELS;  Surgeon: Sinclair Ship, MD;  Location: [facility];  Service: Orthopedics;  Laterality: Right;  Anterior cervical decompression fusion, cervical 4-5, cervical 5-6, cervical 6-7 with instrumentation, allograft.  [name] JOINT REPLACEMENT     Left Knee x2.  . tooth extraction  09/2017    FAMILY HISTORY: No family history on file.  SOCIAL HISTORY: Social History   Socioeconomic History  . Marital status: Married    Spouse name: Not on file  . Number of children: Not on file  . Years of education: Not on file  . Highest education level: Not on file   Occupational History  . Not on file  Social Needs  . Financial resource strain: Not on file  . Food insecurity:    Worry: Not on file    Inability: Not on file  . Transportation needs:    Medical: Not on file    Non-medical: Not on file  Tobacco Use  . Smoking status: Never Smoker  . Smokeless tobacco: Never Used  Substance and Sexual Activity  . Alcohol use: Not on file  . Drug use: Not on file  . Sexual activity: Not on file  Lifestyle  . Physical activity:    Days per week: Not on file    Minutes per session: Not on file  . Stress: Not on file  Relationships  . Social connections:    Talks on phone: Not on file    Gets together: Not on file    Attends religious service: Not on file    Active member of club or organization: Not on file    Attends meetings of clubs or organizations: Not on file    Relationship status: Not on file  . Intimate partner violence:    Fear of current or ex partner: Not on file    Emotionally abused: Not on file    Physically abused: Not on file    Forced sexual activity: Not on file  Other Topics Concern  . Not on file  Social History Narrative  . Not on file     PHYSICAL EXAM  Vitals:   02/07/18 1540  BP: 125/73  Pulse: 70  Weight: 208 lb (94.3 kg)  Height: 6' (1.829 m)   Body mass index is 28.21 kg/m.  Generalized: Well developed, in no acute distress  Head: normocephalic and atraumatic,. Oropharynx benign  Neck: Supple, no carotid bruits  Cardiac: Regular rate rhythm, no murmur  Musculoskeletal: No deformity   Neurological examination   Mentation: Alert oriented to time, place, history taking. Attention span and concentration appropriate. Recent and remote memory intact.  Follows all commands speech and language fluent. ESS 14 FSS 23  Cranial nerve II-XII: Pupils were equal round reactive to light extraocular movements were full, visual field were full on confrontational test. Facial sensation and strength were normal.  hearing was intact to finger rubbing bilaterally. Uvula and tongue are midline. No tremor , no tongue bites.   head turning and shoulder shrug were normal and symmetric.Tongue protrusion into cheek strength was normal. Motor: normal bulk and tone, full  strength in the BUE, BLE, fine finger movements normal, no pronator drift. No focal weakness Reflexes: 2/2, plantar responses were flexor bilaterally. Gait and Station: Rising up from seated position without assistance, normal stance,  moderate stride, good arm swing, smooth turning, able to perform tiptoe, and heel walking without difficulty. Tandem gait is steady  DIAGNOSTIC DATA (LABS, IMAGING, TESTING) - I reviewed patient records, labs, notes, testing and imaging myself where available.      Component Value Date/Time   NA 140 07/20/2017 1117   K 4.3 07/20/2017 1117   CL 101 07/20/2017 1117   CO2 23 07/20/2017 1117   GLUCOSE 92 07/20/2017 1117   GLUCOSE 96 07/10/2012 0855   BUN 21 07/20/2017 1117   CREATININE 1.02 07/20/2017 1117   CALCIUM 9.6 07/20/2017 1117   PROT 7.1 07/20/2017 1117   ALBUMIN 4.4 07/20/2017 1117   AST 22 07/20/2017 1117   ALT 22 07/20/2017 1117   ALKPHOS 74 07/20/2017 1117   BILITOT 0.2 07/20/2017 1117   GFRNONAA 77 07/20/2017 1117   GFRAA 89 07/20/2017 1117    ASSESSMENT AND PLAN   15 minute RV -   67 y.o. year old male  has a past medical history of Anxiety and Hypertension (2010). here to follow up for positional dependent apnea, patient is using a tennis ball. He has severe PLMS although his wife reports his movements have lessened..He has nocturia once or twice at night better when reducing fluids.He continues to report today that he can fall asleep at his desk for 10-20 minutes. When he wakes up there is a variety of colors on the wall that eventually go away.He struggles to stay awake when he is not physically active or mentally stimulated.  EEG performed for 50 minutes was normal.      [name]-   No follow up needed. Patient is doing well.  [facility] [address], [place], [place] [address] [phone]

## 2019-05-25 ENCOUNTER — Other Ambulatory Visit: Payer: Self-pay | Admitting: Otolaryngology

## 2019-05-30 ENCOUNTER — Telehealth: Payer: Self-pay | Admitting: *Deleted

## 2019-05-30 ENCOUNTER — Other Ambulatory Visit: Payer: Self-pay | Admitting: Otolaryngology

## 2019-05-30 DIAGNOSIS — C099 Malignant neoplasm of tonsil, unspecified: Secondary | ICD-10-CM

## 2019-05-30 NOTE — Telephone Encounter (Signed)
Oncology Nurse Navigator Documentation  Placed introductory call to new referral patient Mr. Viner.  Introduced myself as the H&N oncology nurse navigator that works with Dr. Isidore Moos to whom he has been referred by Dr. Lucia Gaskins.  He confirmed understanding of referral.  Briefly explained my role as his navigator, provided my contact information.   Confirmed understanding appts with Dr. Isidore Moos and Dr. Maylon Peppers, MedOnc, to be scheduled s/p CT and PET scans currently scheduled 7/23 at [facility].  He voiced understanding I will check on earlier appt time.  I explained the purpose of a dental evaluation prior to starting RT, indicated he wd be contacted by [facility] to arrange an appt within a day or so of appt with Dr. Isidore Moos.   I encouraged him to call with questions/concerns as he moves forward with appts and procedures.    He verbalized understanding of information provided, expressed appreciation for my call.   Navigator Initial Assessment . Employment Status/FMLA/STD: retired . Support System: wife . PCP: Darcus Austin . PCD:  Bridgett Larsson . Transportation Needs: none . Sensory Deficits/Language Barriers/Interpreter Needed:  none . Ambulation Needs: none . DME Used in Home: none . Psychosocial Needs:  none . Concerns/Needs Understanding Cancer:  Answered by navigator. . Self-Expressed Needs:  none

## 2019-05-31 ENCOUNTER — Telehealth: Payer: Self-pay | Admitting: *Deleted

## 2019-05-31 ENCOUNTER — Other Ambulatory Visit: Payer: Self-pay | Admitting: *Deleted

## 2019-05-31 DIAGNOSIS — C099 Malignant neoplasm of tonsil, unspecified: Secondary | ICD-10-CM

## 2019-05-31 NOTE — Telephone Encounter (Signed)
Oncology Nurse Navigator Documentation  Called Mr. Caleb Hoffman, informed:  PET and CT Neck now Monday, 7/20, [facility], 11:30 PET and 1:30 CT Neck vs 7/23 as previously scheduled.    He is to arrive 10:00 7/20 [facility] for labs prior to PET.  7/23 1:00 appt with Dr. Maylon Peppers, [facility].  To be called by [facility] to schedule appt.  He voiced understanding of appt information, appt locations.  Gayleen Orem, RN, BSN Head & Neck Oncology Nurse [place] at [place] [phone]

## 2019-06-01 ENCOUNTER — Telehealth: Payer: Self-pay | Admitting: *Deleted

## 2019-06-01 NOTE — Telephone Encounter (Signed)
Oncology Nurse Navigator Documentation  Rec'd call from patient, confirmed his understanding of next week's appts.  Gayleen Orem, RN, BSN Head & Neck Oncology Nurse [place] at [place] [phone]

## 2019-06-04 ENCOUNTER — Encounter
Admission: RE | Admit: 2019-06-04 | Discharge: 2019-06-04 | Disposition: A | Discharge status: Home / Self Care | Payer: Medicare Other | Source: Ambulatory Visit | Attending: Otolaryngology | Admitting: Otolaryngology

## 2019-06-04 ENCOUNTER — Other Ambulatory Visit: Payer: Self-pay | Admitting: *Deleted

## 2019-06-04 ENCOUNTER — Telehealth: Payer: Self-pay | Admitting: *Deleted

## 2019-06-04 ENCOUNTER — Other Ambulatory Visit
Admission: RE | Admit: 2019-06-04 | Discharge: 2019-06-04 | Disposition: A | Discharge status: Home / Self Care | Payer: Medicare Other | Source: Ambulatory Visit | Attending: Hematology | Admitting: Hematology

## 2019-06-04 ENCOUNTER — Telehealth (HOSPITAL_COMMUNITY): Payer: Self-pay | Admitting: Dentistry

## 2019-06-04 ENCOUNTER — Ambulatory Visit
Admission: RE | Admit: 2019-06-04 | Discharge: 2019-06-04 | Disposition: A | Discharge status: Home / Self Care | Payer: Medicare Other | Source: Ambulatory Visit | Attending: Otolaryngology | Admitting: Otolaryngology

## 2019-06-04 DIAGNOSIS — C099 Malignant neoplasm of tonsil, unspecified: Secondary | ICD-10-CM

## 2019-06-04 LAB — COMPREHENSIVE METABOLIC PANEL
ALT: 21 U/L (ref 0–44)
AST: 21 U/L (ref 15–41)
Albumin: 4.4 g/dL (ref 3.5–5.0)
Alkaline Phosphatase: 64 U/L (ref 38–126)
Anion gap: 7 (ref 5–15)
BUN: 25 mg/dL — ABNORMAL HIGH (ref 8–23)
CO2: 29 mmol/L (ref 22–32)
Calcium: 9.4 mg/dL (ref 8.9–10.3)
Chloride: 103 mmol/L (ref 98–111)
Creatinine, Ser: 0.97 mg/dL (ref 0.61–1.24)
GFR calc Af Amer: >60 mL/min (ref >60)
GFR calc non Af Amer: >60 mL/min (ref >60)
Glucose, Bld: 104 mg/dL — ABNORMAL HIGH (ref 70–99)
Potassium: 4.7 mmol/L (ref 3.5–5.1)
Sodium: 139 mmol/L (ref 135–145)
Total Bilirubin: 1.1 mg/dL (ref 0.3–1.2)
Total Protein: 7.3 g/dL (ref 6.5–8.1)

## 2019-06-04 LAB — CBC WITH DIFFERENTIAL/PLATELET
Abs Immature Granulocytes: 0.01 10*3/uL (ref 0.00–0.07)
Basophils Absolute: 0 10*3/uL (ref 0.0–0.1)
Basophils Relative: 0 %
Eosinophils Absolute: 0.1 10*3/uL (ref 0.0–0.5)
Eosinophils Relative: 1 %
HCT: 43.9 % (ref 39.0–52.0)
Hemoglobin: 15 g/dL (ref 13.0–17.0)
Immature Granulocytes: 0 %
Lymphocytes Relative: 32 %
Lymphs Abs: 2.6 10*3/uL (ref 0.7–4.0)
MCH: 31 pg (ref 26.0–34.0)
MCHC: 34.2 g/dL (ref 30.0–36.0)
MCV: 90.7 fL (ref 80.0–100.0)
Monocytes Absolute: 0.7 10*3/uL (ref 0.1–1.0)
Monocytes Relative: 9 %
Neutro Abs: 4.8 10*3/uL (ref 1.7–7.7)
Neutrophils Relative %: 58 %
Platelets: 264 10*3/uL (ref 150–400)
RBC: 4.84 MIL/uL (ref 4.22–5.81)
RDW: 12.3 % (ref 11.5–15.5)
WBC: 8.2 10*3/uL (ref 4.0–10.5)
nRBC: 0 % (ref 0.0–0.2)

## 2019-06-04 LAB — GLUCOSE, CAPILLARY: Glucose-Capillary: 92 mg/dL (ref 70–99)

## 2019-06-04 LAB — POCT I-STAT CREATININE: Creatinine, Ser: 0.9 mg/dL (ref 0.61–1.24)

## 2019-06-04 MED ORDER — FLUDEOXYGLUCOSE F - 18 (FDG) INJECTION
10.8000 | Freq: Once | INTRAVENOUS | Status: AC | PRN
Start: 2019-06-04 — End: 2019-06-04
  Administered 2019-06-04: 10.25 via INTRAVENOUS

## 2019-06-04 MED ORDER — IOHEXOL 300 MG/ML  SOLN
75.0000 mL | Freq: Once | INTRAMUSCULAR | Status: AC | PRN
  Administered 2019-06-04: 75 mL via INTRAVENOUS

## 2019-06-04 NOTE — Telephone Encounter (Signed)
06/04/2019   Patient:            Caleb Hoffman Date of Birth:  [birth year] MRN:                [id number]   [facility] to have patient call Dental medicine to schedule PreXRt Dental consultation.   Dr. Enrique Sack

## 2019-06-04 NOTE — Telephone Encounter (Signed)
A user error has taken place: encounter opened in error, closed for administrative reasons.

## 2019-06-04 NOTE — Progress Notes (Signed)
[place] NOTE  Patient Care Team: Pa, Wilson as PCP - General (Family Medicine) Rozetta Nunnery, MD as Consulting Physician (Otolaryngology) Tish Men, MD as Consulting Physician (Hematology) Eppie Gibson, MD as Attending Physician (Radiation Oncology) Leota Sauers, RN as Oncology Nurse Navigator  HEME/ONC OVERVIEW: 1. 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 Lucia Gaskins of ENT in 05/2019 for evaluation and underwent biopsy of the left tonsil, which showed invasive squamous cell carcinoma, p16 pending.  CT neck a 2cm left tonsillar mass w/ ipsilateral 1.7cm jugulodigastric LN without evidence of ECE. PET confirmed the same findings and there was no evidence of metastatic disease. -I reviewed the imaging and pathology results in detail with the patient, as well as NCCN guideline -I discussed with the patient about multidisciplinary approach for head and neck cancer, and various approaches to managing head and neck cancer, including upfront surgical resection followed by adjuvant treatment based on the final pathology vs definitive chemoradiation -We briefly discussed the rationale for chemoradiation, as well as some of the potential toxicities of chemotherapy -In light of relatively low tumor burden, including a small left cervical LN involvement without evidence of ECE, he may be a good candidate  for upfront resection, followed by adjuvant therapy as needed based on the final pathology -I discussed the case at length with Dr. Lucia Gaskins, who reviewed the CT images and felt that he could resect the primary tumor with negative margins and left neck dissection -If the pathology shows negative margins and there is no evidence of ECE, then the patient may only require adjuvant RT and can avoid chemotherapy  -After lengthy discussion, patient expressed interest to hear about TORS at [facility], and I have placed the referral accordingly   Orders Placed This Encounter  Procedures  . Ambulatory referral to ENT    Referral Priority:   Routine    Referral Type:   Consultation    Referral Reason:   Specialty Services Required    Requested Specialty:   Otolaryngology    Number of Visits Requested:   1    A total of more than 60 minutes were spent face-to-face with the patient during this encounter and over half of that time was spent on counseling and coordination of care as outlined above.    All questions were answered. The patient knows to call the clinic with any problems, questions or concerns.  Return to be determined, pending ENT evaluation for surgery.  Tish Men, MD 06/06/2019 2:07 PM   CHIEF COMPLAINTS/PURPOSE OF CONSULTATION:  "I am just a little nervous"  HISTORY OF PRESENTING ILLNESS:  Caleb Hoffman 68 y.o. male is here because of newly diagnosed squamous cell carcinoma of the left tonsil.  Patient reports that approximately 3 to 4 months ago, he noticed a "flapping" sensation in the throat when he eats.  He went to his dentist in early 05/2019, who noticed left tonsil mass  and referred the patient to Dr. Lucia Gaskins of ENT for further evaluation.  He underwent biopsy of the left tonsil, which show squamous cell carcinoma, p16+.  CT neck showed biopsy-proven malignancy, as well as 1 isolated ipsilateral jugulodigastric LN. PET scan was negative for metastatic disease.  Patient was  referred to oncology for further evaluation.  Patient reports that he still has the "flapping" sensation in his throat when he eats, but he denies any significant dysphagia or odynophagia.  He has lost approximately 10 pounds over the past few weeks, but he attributes it to increasing exercise dietary modifications.  He denies any other complaint today.   I have reviewed his chart and materials related to his cancer extensively and collaborated history with the patient. Summary of oncologic history is as follows: Oncology History  Squamous cell carcinoma of left tonsil ([place])  06/04/2019 Imaging   CT neck: IMPRESSION: 2 cm left tonsillar mass with ipsilateral solitary jugulodigastric adenopathy measuring 17 mm.   06/04/2019 Imaging   PET: IMPRESSION: 1. Hypermetabolic left palatine tonsillar mass, maximum SUV 14.5, compatible with malignancy. 2. Adjacent level IIa lymph node measuring 1.3 cm in short axis has a maximum SUV of 9.0, compatible with malignant involvement. 3. No other findings of active malignancy identified. 4. Small amount of free pelvic fluid, etiology uncertain. 5. Other imaging findings of potential clinical significance: Aortic Atherosclerosis (ICD10-I70.0). Large left kidney upper pole cyst. Chronic bilateral pars defects at L5.   06/05/2019 Initial Diagnosis   Squamous cell carcinoma of left tonsil [facility])     MEDICAL HISTORY:  Past Medical History:  Diagnosis Date  . Anxiety   . Hypertension 2010    SURGICAL HISTORY: Past Surgical History:  Procedure Laterality Date  . ANTERIOR CERVICAL DECOMP/DISCECTOMY FUSION  07/13/2012   Procedure: ANTERIOR CERVICAL DECOMPRESSION/DISCECTOMY FUSION 3 LEVELS;  Surgeon: Sinclair Ship, MD;  Location: [place];  Service: Orthopedics;  Laterality: Right;  Anterior cervical decompression fusion, cervical 4-5, cervical 5-6, cervical 6-7 with instrumentation, allograft.  [name] KNEE ARTHROSCOPY     Left Knee x2.  [name] TOOTH  EXTRACTION  09/2017    SOCIAL HISTORY: Social History   Socioeconomic History  . Marital status: Married    Spouse name: Not on file  . Number of children: 2  . Years of education: Not on file  . Highest education level: Not on file  Occupational History  . Not on file  Social Needs  . Financial resource strain: Not on file  . Food insecurity    Worry: Not on file    Inability: Not on file  . Transportation needs    Medical: Not on file    Non-medical: Not on file  Tobacco Use  . Smoking status: Never Smoker  . Smokeless tobacco: Never Used  Substance and Sexual Activity  . Alcohol use: Not on file    Comment: Rare  . Drug use: Not on file  . Sexual activity: Not on file  Lifestyle  . Physical activity    Days per week: Not on file    Minutes per session: Not on file  . Stress: Not on file  Relationships  . Social Herbalist on phone: Not on file    Gets together: Not on file    Attends religious service: Not on file    Active member of club or organization: Not on file    Attends meetings of clubs or organizations: Not on file    Relationship status: Not  on file  . Intimate partner violence    Fear of current or ex partner: Not on file    Emotionally abused: Not on file    Physically abused: Not on file    Forced sexual activity: Not on file  Other Topics Concern  . Not on file  Social History Narrative  . Not on file    FAMILY HISTORY: Family History  Problem Relation Age of Onset  . Heart disease Mother   . Heart disease Father     ALLERGIES:  has No Known Allergies.  MEDICATIONS:  Current Outpatient Medications  Medication Sig Dispense Refill  . amLODipine (NORVASC) 10 MG tablet Take 10 mg by mouth daily.    [name] aspirin EC 81 MG tablet Take 81 mg by mouth daily.    . COMBIGAN 0.2-0.5 % ophthalmic solution Apply 1 drop to eye daily. 1 drop in Right eye daily.    [name] FLUoxetine (PROZAC) 20 MG capsule Take 20 mg by mouth daily.    [name]  gabapentin (NEURONTIN) 300 MG capsule Take 300 mg by mouth 3 (three) times daily.    [name] lisinopril-hydrochlorothiazide (PRINZIDE,ZESTORETIC) 20-12.5 MG per tablet Take 1 tablet by mouth daily.    [name] LORazepam (ATIVAN) 0.5 MG tablet Take 0.5 mg by mouth daily.    . Multiple Vitamin (MULTIVITAMIN WITH MINERALS) TABS Take 1 tablet by mouth daily.    . sodium fluoride (PREVIDENT 5000 PLUS) 1.1 % CREA dental cream Apply small amount to tooth brush. Brush teeth for 2 minutes. Spit out excess. DO NOT rinse afterwards. Repeat nightly. 1 Tube prn   No current facility-administered medications for this visit.     REVIEW OF SYSTEMS:   Constitutional: ( - ) fevers, ( - )  chills , ( - ) night sweats Eyes: ( - ) blurriness of vision, ( - ) double vision, ( - ) watery eyes Ears, nose, mouth, throat, and face: ( - ) mucositis, ( - ) sore throat Respiratory: ( - ) cough, ( - ) dyspnea, ( - ) wheezes Cardiovascular: ( - ) palpitation, ( - ) chest discomfort, ( - ) lower extremity swelling Gastrointestinal:  ( - ) nausea, ( - ) heartburn, ( - ) change in bowel habits Skin: ( - ) abnormal skin rashes Lymphatics: ( - ) new lymphadenopathy, ( - ) easy bruising Neurological: ( - ) numbness, ( - ) tingling, ( - ) new weaknesses Behavioral/Psych: ( - ) mood change, ( - ) new changes  All other systems were reviewed with the patient and are negative.  PHYSICAL EXAMINATION: ECOG PERFORMANCE STATUS: 1 - Symptomatic but completely ambulatory  Vitals:   06/06/19 1307  BP: 122/71  Pulse: 60  Resp: 17  Temp: 99.1 F (37.3 C)  SpO2: 97%   Filed Weights   06/06/19 1307  Weight: 188 lb 8 oz (85.5 kg)    GENERAL: alert, no distress and comfortable SKIN: skin color, texture, turgor are normal, no rashes or significant lesions EYES: conjunctiva are pink and non-injected, sclera clear OROPHARYNX: no exudate, no erythema; lips, buccal mucosa, and tongue normal; unable to visualize the left tonsil  NECK: supple,  non-tender LYMPH:  no palpable lymphadenopathy in the cervical LUNGS: clear to auscultation with normal breathing effort HEART: regular rate & rhythm, no murmurs, no lower extremity edema ABDOMEN: soft, non-tender, non-distended, normal bowel sounds Musculoskeletal: no cyanosis of digits and no clubbing  PSYCH: alert & oriented x 3, fluent speech NEURO: no focal motor/sensory deficits  LABORATORY DATA:  I have reviewed the data as listed Lab Results  Component Value Date   WBC 8.2 06/04/2019   HGB 15.0 06/04/2019   HCT 43.9 06/04/2019   MCV 90.7 06/04/2019   PLT 264 06/04/2019   Lab Results  Component Value Date   NA 139 06/04/2019   K 4.7 06/04/2019   CL 103 06/04/2019   CO2 29 06/04/2019    RADIOGRAPHIC STUDIES: I have personally reviewed the radiological images as listed and agreed with the findings in the report. Ct Soft Tissue Neck W Contrast  Result Date: 06/05/2019 CLINICAL DATA:  Left tonsil cancer EXAM: CT NECK WITH CONTRAST TECHNIQUE: Multidetector CT imaging of the neck was performed using the standard protocol following the bolus administration of intravenous contrast. CONTRAST:  87mL OMNIPAQUE IOHEXOL 300 MG/ML  SOLN COMPARISON:  Head CT from the same day FINDINGS: Pharynx and larynx: 2 cm left tonsillar mass causing asymmetric oropharyngeal effacement on the left. When accounting for streak artifact no submucosal tumor invasion. Salivary glands: No inflammation, mass, or stone. Thyroid: Essentially normal Lymph nodes: Enlarged and rounded left jugulodigastric node measuring 17 mm in diameter. No necrosis or gross extracapsular tumor is seen. Vascular: Atherosclerotic calcification Limited intracranial: Negative Visualized orbits: Negative Mastoids and visualized paranasal sinuses: Clear Skeleton: C4-C7 ACDF with solid arthrodesis Upper chest: Negative IMPRESSION: 2 cm left tonsillar mass with ipsilateral solitary jugulodigastric adenopathy measuring 17 mm. Electronically  Signed   By: Monte Fantasia M.D.   On: 06/05/2019 05:05   Nm Pet Image Initial (pi) Skull Base To Thigh  Result Date: 06/04/2019 CLINICAL DATA:  Initial treatment strategy for tonsillar cancer. EXAM: NUCLEAR MEDICINE PET SKULL BASE TO THIGH TECHNIQUE: 10.3 mCi F-18 FDG was injected intravenously. Full-ring PET imaging was performed from the skull base to thigh after the radiotracer. CT data was obtained and used for attenuation correction and anatomic localization. Fasting blood glucose: 92 mg/dl COMPARISON:  CT neck from today. FINDINGS: Mediastinal blood pool activity: SUV max 2.5 Liver activity: SUV max NA NECK: A left palatine tonsillar mass has a maximum SUV of 14.5, compatible with malignancy. In adjacent level IIa lymph node measuring 1.3 cm in short axis on image 39/3 has a maximum SUV of 9.0, compatible with malignant involvement. There several small level IIb lymph nodes just below this level, the largest of which measures 0.5 cm in short axis as on 44/3 of the concurrent diagnostic CT scan. These have a maximum SUV of 1.8, significantly less than the blood pool. No other pathologic or hypermetabolic adenopathy in the neck is identified. Incidental CT findings: none CHEST: No significant abnormal hypermetabolic activity in this region. Incidental CT findings: Atherosclerotic calcification of the aortic arch. ABDOMEN/PELVIS: No significant abnormal hypermetabolic activity in this region. Incidental CT findings: Large photopenic cyst of the left kidney upper pole. Smaller hypodense lesion of the right mid kidney. Aortoiliac atherosclerotic vascular disease. Left posterior bladder diverticulum. Small amount of free pelvic fluid as on image 371/0, cause uncertain. SKELETON: No significant abnormal hypermetabolic activity in this region. Incidental CT findings: Cervical spine fusion. Chronic bilateral pars defects at L5. IMPRESSION: 1. Hypermetabolic left palatine tonsillar mass, maximum SUV 14.5,  compatible with malignancy. 2. Adjacent level IIa lymph node measuring 1.3 cm in short axis has a maximum SUV of 9.0, compatible with malignant involvement. 3. No other findings of active malignancy identified. 4. Small amount of free pelvic fluid, etiology uncertain. 5. Other imaging findings of potential clinical significance: Aortic Atherosclerosis (ICD10-I70.0). Large left  kidney upper pole cyst. Chronic bilateral pars defects at L5. Electronically Signed   By: Van Clines M.D.   On: 06/04/2019 15:45    PATHOLOGY: I have reviewed the pathology reports as documented in the oncologist history.

## 2019-06-04 NOTE — Telephone Encounter (Signed)
Oncology Nurse Navigator Documentation  Rec'd call from pt stating [facility] unable to see CMP and CBC labs to be drawn this morning vs [facility] on Wednesday.  I spoke with Dr. Lorette Ang RN [facility], she agreed to call lab to confirm order re-entry so able to collect.  Pt notified.  Gayleen Orem, RN, BSN Head & Neck Oncology Nurse [place] at [place] [phone]

## 2019-06-04 NOTE — Addendum Note (Signed)
Addended by: Santiago Bur on: 06/04/2019 10:33 AM   Modules accepted: Orders

## 2019-06-05 ENCOUNTER — Encounter (HOSPITAL_COMMUNITY): Payer: Self-pay | Admitting: Dentistry

## 2019-06-05 ENCOUNTER — Other Ambulatory Visit: Payer: Self-pay

## 2019-06-05 ENCOUNTER — Ambulatory Visit (HOSPITAL_COMMUNITY): Payer: Self-pay | Admitting: Dentistry

## 2019-06-05 VITALS — BP 126/73 | HR 61 | Temp 98.0°F

## 2019-06-05 DIAGNOSIS — C090 Malignant neoplasm of tonsillar fossa: Secondary | ICD-10-CM | POA: Insufficient documentation

## 2019-06-05 DIAGNOSIS — K036 Deposits [accretions] on teeth: Secondary | ICD-10-CM

## 2019-06-05 DIAGNOSIS — M270 Developmental disorders of jaws: Secondary | ICD-10-CM

## 2019-06-05 DIAGNOSIS — K030 Excessive attrition of teeth: Secondary | ICD-10-CM

## 2019-06-05 DIAGNOSIS — K06010 Localized gingival recession, unspecified: Secondary | ICD-10-CM

## 2019-06-05 DIAGNOSIS — M2630 Unspecified anomaly of tooth position of fully erupted tooth or teeth: Secondary | ICD-10-CM

## 2019-06-05 DIAGNOSIS — K031 Abrasion of teeth: Secondary | ICD-10-CM

## 2019-06-05 DIAGNOSIS — K08409 Partial loss of teeth, unspecified cause, unspecified class: Secondary | ICD-10-CM

## 2019-06-05 DIAGNOSIS — K0530 Chronic periodontitis, unspecified: Secondary | ICD-10-CM

## 2019-06-05 DIAGNOSIS — C099 Malignant neoplasm of tonsil, unspecified: Secondary | ICD-10-CM

## 2019-06-05 DIAGNOSIS — Z01818 Encounter for other preprocedural examination: Secondary | ICD-10-CM

## 2019-06-05 DIAGNOSIS — K0889 Other specified disorders of teeth and supporting structures: Secondary | ICD-10-CM

## 2019-06-05 MED ORDER — SODIUM FLUORIDE 1.1 % DT CREA: TOPICAL_CREAM | DENTAL | 99 refills | Status: DC

## 2019-06-05 NOTE — Progress Notes (Signed)
Head and Neck Cancer Location of Tumor / Histology:  05/25/19 Diagnosis Tonsil, biopsy, left - INVASIVE SQUAMOUS CELL CARCINOMA  Patient presented months ago with symptoms of: He mentioned a "flap when he swallowed" to his dentist.   Biopsies of left tonsil revealed: invasive squamous cell carcinoma  Nutrition Status Yes No Comments  Weight changes? [x]  []  He reports losing about 10 lbs in the last 2 weeks.   Swallowing concerns? []  [x]  He denies.   PEG? []  []     Referrals Yes No Comments  Social Work? []  [x]    Dentistry? [x]  []  Dr. Enrique Sack 06/05/19  Swallowing therapy? []  [x]    Nutrition? []  [x]    Med/Onc? [x]  []  Dr. Maylon Peppers 06/06/19   Safety Issues Yes No Comments  Prior radiation? []  [x]    Pacemaker/ICD? []  [x]    Possible current pregnancy? []  [x]    Is the patient on methotrexate? []  []     Tobacco/Marijuana/Snuff/ETOH use: He has never smoked. He drinks alcohol rarely.   Past/Anticipated interventions by otolaryngology, if any:  Dr. Lucia Gaskins  Past/Anticipated interventions by medical oncology, if any:  Dr. Maylon Peppers 06/06/19.   Current Complaints / other details:

## 2019-06-05 NOTE — Progress Notes (Signed)
DENTAL CONSULTATION  Date of Consultation:  06/05/2019 Patient Name:   Caleb Hoffman Date of Birth:   1950/12/11 Medical Record Number: [id number]  COVID 19 SCREENING: The patient does not symptoms concerning for COVID-19 infection (Including fever, chills, cough, or new SHORTNESS OF BREATH).    VITALS: BP 126/73 (BP Location: Right Arm)   Pulse 61   Temp 98 F (36.7 C)   CHIEF COMPLAINT: Patient was referred by Dr. Isidore Moos for a dental consultation.  HPI: Caleb Hoffman is a 68 year old male recently diagnosed with squamous cell carcinoma of the left tonsil.  Patient with anticipated chemoradiation therapy.  Patient is now seen as part of medically necessary pre-chemoradiation therapy dental protocol examination.  The patient currently denies acute toothaches, swellings, or abscesses.  Patient was last seen by his periodontist, Dr.Robert Geralynn Ochs, for periodontal maintenance procedure on May 24, 2019.  Prior to that, the patient had seen his general dentist, Dr. Bridgett Larsson on 06/21/2018 for an exam, cleaning, and 4 bitewings.  The patient is seen on an every 32-month basis.  Patient sees the periodontist 2 times a year and the general dentist 1 time a year. Patient denies having any partial dentures.  The patient does have an implant in the lower right quadrant in the area of tooth #31. Patient denies having dental phobia.  PROBLEM LIST: Patient Active Problem List   Diagnosis Date Noted  . Squamous cell carcinoma of left tonsil (HCC) 06/05/2019    Priority: High  . Mild obstructive sleep apnea 02/07/2018  . Visual disturbance 11/09/2017  . Uncontrolled daytime somnolence 08/30/2017  . PLMD (periodic limb movement disorder) 07/20/2017  . Nocturia more than twice per night 07/20/2017  . Urge incontinence of urine 07/20/2017  . S/P cervical spinal fusion 07/20/2017  . Hypersomnia with sleep apnea 02/01/2017  . Snoring 02/01/2017  . Abnormal REM sleep 02/01/2017     PMH: Past  Medical History:  Diagnosis Date  . Anxiety   . Hypertension 2010    PSH: Past Surgical History:  Procedure Laterality Date  . ANTERIOR CERVICAL DECOMP/DISCECTOMY FUSION  07/13/2012   Procedure: ANTERIOR CERVICAL DECOMPRESSION/DISCECTOMY FUSION 3 LEVELS;  Surgeon: Sinclair Ship, MD;  Location: [facility];  Service: Orthopedics;  Laterality: Right;  Anterior cervical decompression fusion, cervical 4-5, cervical 5-6, cervical 6-7 with instrumentation, allograft.  [name] KNEE ARTHROSCOPY     Left Knee x2.  [name] TOOTH EXTRACTION  09/2017     ALLERGIES: No Known Allergies  MEDICATIONS: Current Outpatient Medications  Medication Sig Dispense Refill  . amLODipine (NORVASC) 10 MG tablet Take 10 mg by mouth daily.    [name] aspirin EC 81 MG tablet Take 81 mg by mouth daily.    [name] FLUoxetine (PROZAC) 20 MG capsule Take 20 mg by mouth daily.    [name] gabapentin (NEURONTIN) 300 MG capsule Take 300 mg by mouth 3 (three) times daily.    [name] lisinopril-hydrochlorothiazide (PRINZIDE,ZESTORETIC) 20-12.5 MG per tablet Take 1 tablet by mouth daily.    [name] LORazepam (ATIVAN) 0.5 MG tablet Take 0.5 mg by mouth daily.    . Multiple Vitamin (MULTIVITAMIN WITH MINERALS) TABS Take 1 tablet by mouth daily.    [name] amLODipine (NORVASC) 5 MG tablet Take 10 mg by mouth daily.      No current facility-administered medications for this visit.      LABS: Lab Results  Component Value Date   WBC 8.2 06/04/2019   HGB 15.0 06/04/2019   HCT 43.9 06/04/2019   MCV  90.7 06/04/2019   PLT 264 06/04/2019      Component Value Date/Time   NA 139 06/04/2019 1042   NA 140 07/20/2017 1117   K 4.7 06/04/2019 1042   CL 103 06/04/2019 1042   CO2 29 06/04/2019 1042   GLUCOSE 104 (H) 06/04/2019 1042   BUN 25 (H) 06/04/2019 1042   BUN 21 07/20/2017 1117   CREATININE 0.90 06/04/2019 1130   CALCIUM 9.4 06/04/2019 1042   GFRNONAA >60 06/04/2019 1042   GFRAA >60 06/04/2019 1042   Lab Results  Component Value Date   INR 0.94 07/10/2012    No results found for: PTT  SOCIAL HISTORY: Social History   Socioeconomic History  . Marital status: Married    Spouse name: Not on file  . Number of children: 2  . Years of education: Not on file  . Highest education level: Not on file  Occupational History  . Not on file  Social Needs  . Financial resource strain: Not on file  . Food insecurity    Worry: Not on file    Inability: Not on file  . Transportation needs    Medical: Not on file    Non-medical: Not on file  Tobacco Use  . Smoking status: Never Smoker  . Smokeless tobacco: Never Used  Substance and Sexual Activity  . Alcohol use: Not on file    Comment: Rare  . Drug use: Not on file  . Sexual activity: Not on file  Lifestyle  . Physical activity    Days per week: Not on file    Minutes per session: Not on file  . Stress: Not on file  Relationships  . Social Herbalist on phone: Not on file    Gets together: Not on file    Attends religious service: Not on file    Active member of club or organization: Not on file    Attends meetings of clubs or organizations: Not on file    Relationship status: Not on file  . Intimate partner violence    Fear of current or ex partner: Not on file    Emotionally abused: Not on file    Physically abused: Not on file    Forced sexual activity: Not on file  Other Topics Concern  . Not on file  Social History Narrative  . Not on file     FAMILY HISTORY: Family History  Problem Relation Age of Onset  . Heart disease Mother   . Heart disease Father     REVIEW OF SYSTEMS: Reviewed with the patient as per History of present illness. Psych: Patient denies having dental phobia.  DENTAL HISTORY: CHIEF COMPLAINT: Patient was referred by Dr. Isidore Moos for a dental consultation.  HPI: Caleb Hoffman is a 67 year old male recently diagnosed with squamous cell carcinoma of the left tonsil.  Patient with anticipated chemoradiation therapy.  Patient is now  seen as part of medically necessary pre-chemoradiation therapy dental protocol examination.  The patient currently denies acute toothaches, swellings, or abscesses.  Patient was last seen by his periodontist, Dr.Robert Geralynn Ochs, for periodontal maintenance procedure on May 24, 2019.  Prior to that, the patient had seen his general dentist, Dr. Bridgett Larsson on 06/21/2018 for an exam, cleaning, and 4 bitewings.  The patient is seen on an every 35-month basis.  Patient sees the periodontist 2 times a year and the general dentist 1 time a year. Patient denies having any partial dentures.  The patient  does have an implant in the lower right quadrant in the area of tooth #31. Patient denies having dental phobia.  DENTAL EXAMINATION: GENERAL: The patient is a well-developed, well-nourished male no acute distress. HEAD AND NECK: There is a left neck lymphadenopathy.  There is no right neck lymphadenopathy palpated.  Patient denies acute TMJ symptoms. INTRAORAL EXAM: Patient has normal saliva.  There is no evidence of oral abscess formation. Maximum inter-incisal opening is 55 mm. There is a mandibular right lingual torus. DENTITION: Patient is missing tooth numbers 1, 16, 17, 31, and 32.  Tooth #31 has been replaced with an implant. PERIODONTAL: Patient has chronic periodontitis with plaque accumulations, gingival recession, and no significant tooth mobility.  There is incipient a moderate bone loss noted.  The patient has incipient mandibular anterior tooth mobility. DENTAL CARIES/SUBOPTIMAL RESTORATIONS: No obvious dental caries are noted. Multiple flexure lesions are noted. ENDODONTIC: The patient denies acute pulpitis symptoms.  Patient has had previous root canal therapy associated with tooth numbers 18 and 19. CROWN AND BRIDGE: There are crown restorations and tooth numbers 4, 5, 14, 15, 18, 19, 30, and 31. PROSTHODONTIC: There are no partial dentures. OCCLUSION: Patient has a stable occlusion at this  time.  RADIOGRAPHIC INTERPRETATION: Orthopantogram was taken and supplemented with a full series of dental radiographs. Patient is missing tooth numbers 1, 16, 17, 31, and 32.  There is an implant in the area of tooth #31.  There is incipient a moderate bone loss.  There are previous root canal therapies associated with tooth numbers 18 and 19.  There is no obvious periapical radiolucencies noted.  Multiple crown and bridge and dental restorations are noted.   ASSESSMENTS: 1.  Squamous cell carcinoma of the  left tonsil 2.  Pre-chemoradiation therapy dental protocol examination 3.  Chronic periodontitis with bone loss 4.  Gingival recession 5.  Accretions-minimal 6.  Mandibular anterior incipient tooth mobility 7.  Multiple missing teeth 8.  Implant in the area of #31 9. Multiple flexure lesions. 10. Maxillary and mandibular anteriro incisal attrition 11.  Stable occlusion 12.  Mandibular right lingual torus-small   PLAN/RECOMMENDATIONS: 1. I discussed the risks, benefits, and complications of various treatment options with the patient in relationship to his medical and dental conditions, anticipated chemoradiation therapy, and chemoradiation therapy side effects to include xerostomia, radiation caries, trismus, mucositis, taste changes, gum and jawbone changes, and risk for infection and osteoradionecrosis. We discussed various treatment options to include no treatment, extraction of teeth in the primary field radiation therapy, alveoloplasty, pre-prosthetic surgery as indicated, periodontal therapy, dental restorations, root canal therapy, crown and bridge therapy, implant therapy, and replacement of missing teeth as indicated.  We discussed fabrication of fluoride trays and scatter protection devices.  The patient currently is adamant about not wanting dental extractions at this time.  I will need to review the anticipated ports and doses with Dr. Isidore Moos and Dr. Pearlie Oyster ability to keep  teeth out of the field of radiation therapy below dose of 5000 cGy.  I will also discuss with her the future fabrication of scatter protection devices.  Patient did agree to impressions today for the future fabrication of fluoride trays and possible scatter protection devices if indicated.  Prescription for Prevident 5000 Plus fluoride therapy was sent to the [facility] pharmacy in [place], [place] with refills for 1 year.   2. Discussion of findings with medical team and coordination of future medical and dental care as needed.  I spent in excess of  120 minutes during the conduct of this consultation and >50% of this time involved direct face-to-face encounter for counseling and/or coordination of the patient's care.    Lenn Cal, DDS

## 2019-06-05 NOTE — Patient Instructions (Addendum)
COVID-19 Education: The signs and symptoms of COVID-19 were discussed with the patient and how to seek care for testing (follow up with PCP or arrange E-visit).   The importance of social distancing was discussed today.  RADIATION THERAPY AND DECISIONS REGARDING YOUR TEETH  Xerostomia (dry mouth) Your salivary glands may be in the filed of radiation.  Radiation may include all or part of your saliva glands.  This will cause your saliva to dry up and you will have a dry mouth.  The dry mouth will be for the rest of your life unless your radiation oncologist tells you otherwise.  Your saliva has many functions:  Saliva wets your tongue for speaking.  It coats your teeth and the inside of your mouth for easier movement.  It helps with chewing and swallowing food.  It helps clean away harmful acid and toxic products made by the germs in your mouth, therefore it helps prevent cavities.  It kills some germs in your mouth and helps to prevent gum disease.  It helps to carry flavor to your taste buds.  Once you have lost your saliva you will be at higher risk for tooth decay and gum disease.  What can be done to help improve your mouth when there's not enough saliva:  1.  Your dentist may give a prescription for Salagen.  It will not bring back all of your saliva but may bring back some of it.  Also your saliva may be thick and ropy or white and foamy. It will not feel like it use to feel.  2.  You will need to swish with water every time your mouth feels dry.  YOU CANNOT suck on any cough drops, mints, lemon drops, candy, vitamin C or any other products.  You cannot use anything other than water to make your mouth feel less dry.  If you want to drink anything else you have to drink it all at once and brush afterwards.  Be sure to discuss the details of your diet habits with your dentist or hygienist.  Radiation caries: This is decay that happens very quickly once your mouth is very dry due to  radiation therapy.  Normally cavities take six months to two years to become a problem.  When you have dry mouth cavities may take as little as eight weeks to cause you a problem.  This is why dental check ups every two months are necessary as long as you have a dry mouth. Radiation caries typically, but not always, start at your gum line where it is hard to see the cavity.  It is therefore also hard to fill these cavities adequately.  This high rate of cavities happens because your mouth no longer has saliva and therefore the acid made by the germs starts the decay process.  Whenever you eat anything the germs in your mouth change the food into acid.  The acid then burns a small hole in your tooth.  This small hole is the beginning of a cavity.  If this is not treated then it will grow bigger and become a cavity.  The way to avoid this hole getting bigger is to use fluoride every evening as prescribed by your dentist.  You have to make sure that your teeth are very clean before you use the fluoride.  This fluoride in turn will strengthen your teeth and prepare them for another day of fighting acid.  If you develop radiation caries many times the damage is   so large that you will have to have all your teeth removed.  This could be a big problem if some of these teeth are in the field of radiation.  Further details of why this could be a big problem will follow.  (See Osteoradionecrosis).  Loss of taste (dysgeusia) This happens to varying degrees once you've had radiation therapy to your jaw region.  Many times taste is not completely lost but becomes limited.  The loss of taste is mostly due to radiation affecting your taste buds.  However if you have no saliva in your mouth to carry the flavor to your taste buds it would be difficult for your taste buds to taste anything.  That is why using water or a prescription for Salagen prior to meals and during meals may help with some of the taste.  Keep in mind that  taste generally returns very slowly over the course of several months or several years after radiation therapy.  Don't give up hope.  Trismus According to your Radiation Oncologist your TMJ or jaw joints are going to be partially or fully in the field of radiation.  This means that over time the muscles that help you open and close your mouth may get stiff.  This will potentially result in your not being able to open your mouth wide enough or as wide as you can open it now.  Le me give you an example of how slowly this happens and how unaware people are of it.  A gentlemen that had radiation therapy two years ago came back to me complaining that bananas are just too large for him to be able to fit them in between his teeth.  He was not able to open wide enough to bite into a banana.  This happens slowly and over a period of time.  What do we do to try and prevent this?  Your dentist will probably give you a stack of sticks called a trismus exercise device .  This stack will help your remind your muscles and your jaw joint to open up to the same distance every day.  Use these sticks every morning when you wake up according to the instructions given by the dentist.   You must use these sticks for at least one to two years after radiation therapy.  The reason for that is because it happens so slowly and keeps going on for about two years after radiation therapy.  Your hospital dentist will help you monitor your mouth opening and make sure that it's not getting smaller.  Osteoradionecrosis (ORN) This is a condition where your jaw bone after having had radiation therapy becomes very dry.  It has very little blood supply to keep it alive.  If you develop a cavity that turns into an abscess or an infection then the jaw bone does not have enough blood supply to help fight the infection.  At this point it is very likely that the infection could cause the death of your jaw bone.  When you have dead bone it has to be  removed.  Therefore you might end up having to have surgery to remove part of your jaw bone, the part of the jaw bone that has been affected.   Healing is also a problem if you are to have surgery in the areas where the bone has had radiation therapy.  The same reasons apply.  If you have surgery you need more blood supply which is not available.    When blood supply and oxygen are not available again, there is a chance for the bone to die.  Occasionally ORN happens on its own with no obvious reason.  This is quite rare.  We believe that patients who continue to smoke and/or drink alcohol have a higher chance of having this bone problem.  Therefore once your jaw bone has had radiation therapy if there are any teeth in that area, you should never have them pulled.  You should also never have any surgery on your teeth or gums in that area unless the oral surgeon or Periodontist is aware of your history of radiation. There is some expensive management techniques that might be used to limit your risks.  The risks for ORN either from infection or spontaneous ( or on it's own) are life long.    TRISMUS  Trismus is a condition where the jaw does not allow the mouth to open as wide as it usually does.  This can happen almost suddenly, or in other cases the process is so slow, it is hard to notice it-until it is too far along.  When the jaw joints and/or muscles have been exposed to radiation treatments, the onset of Trismus is very slow.  This is because the muscles are losing their stretching ability over a long period of time, as long as 2 YEARS after the end of radiation.  It is therefore important to exercise these muscles and joints.  TRISMUS EXERCISES   Stack of tongue depressors measuring the same or a little less than the last documented MIO (Maximum Interincisal Opening).  Secure them with a rubber band on both ends.  Place the stack in the patient's mouth, supporting the other end.  Allow 30  seconds for muscle stretching.  Rest for a few seconds.  Repeat 3-5 times  For all radiation patients, this exercise is recommended in the mornings and evenings unless otherwise instructed.  The exercise should be done for a period of 2 YEARS after the end of radiation.  MIO should be checked routinely on recall dental visits by the general dentist or the hospital dentist.  The patient is advised to report any changes, soreness, or difficulties encountered when doing the exercises.  

## 2019-06-06 ENCOUNTER — Other Ambulatory Visit: Payer: TRICARE For Life (TFL)

## 2019-06-06 ENCOUNTER — Encounter: Payer: Self-pay | Admitting: *Deleted

## 2019-06-06 ENCOUNTER — Encounter: Payer: Self-pay | Admitting: Hematology

## 2019-06-06 ENCOUNTER — Other Ambulatory Visit: Payer: Self-pay

## 2019-06-06 ENCOUNTER — Inpatient Hospital Stay: Payer: Medicare Other | Attending: Hematology | Admitting: Hematology

## 2019-06-06 VITALS — BP 122/71 | HR 60 | Temp 99.1°F | Resp 17 | Ht 72.0 in | Wt 188.5 lb

## 2019-06-06 DIAGNOSIS — N281 Cyst of kidney, acquired: Secondary | ICD-10-CM | POA: Diagnosis not present

## 2019-06-06 DIAGNOSIS — I700 Atherosclerosis of aorta: Secondary | ICD-10-CM | POA: Diagnosis not present

## 2019-06-06 DIAGNOSIS — R599 Enlarged lymph nodes, unspecified: Secondary | ICD-10-CM

## 2019-06-06 DIAGNOSIS — Z79899 Other long term (current) drug therapy: Secondary | ICD-10-CM

## 2019-06-06 DIAGNOSIS — Z8249 Family history of ischemic heart disease and other diseases of the circulatory system: Secondary | ICD-10-CM | POA: Diagnosis not present

## 2019-06-06 DIAGNOSIS — C099 Malignant neoplasm of tonsil, unspecified: Secondary | ICD-10-CM | POA: Diagnosis present

## 2019-06-06 NOTE — Progress Notes (Signed)
Oncology Nurse Navigator Documentation  Met with patient during initial consult with Dr. Maylon Peppers.  . Further introduced myself as his Navigator, explained my role as a member of the Care Team. . Provided New Patient Information packet: o Contact information for physician, this navigator, other members of the Care Team o Advance Directive information ([place] blue pamphlet with LCSW insert); provided [facility] AD booklet at his request, encouraged him to contact Gwinda [place], LCSW, to complete. o Fall Prevention Patient Mission o SLP information sheet o Symptom Management Clinic information o [facility] campus map with highlight of [place]  He voiced understanding of tmt options, agreed to referral for TORS consult at [facility].  Provided tour of [place] department.  He voiced understanding I will join him during next Tuesday's WebEx consult with Dr. Isidore Moos. Encouraged him to call with questions/concerns.  Gayleen Orem, RN, BSN Head & Neck Oncology Nurse [place] at [place]   Gayleen Orem, RN, BSN Head & Neck Oncology Nurse [place] at [place] [phone]

## 2019-06-07 ENCOUNTER — Telehealth: Payer: Self-pay | Admitting: *Deleted

## 2019-06-07 ENCOUNTER — Other Ambulatory Visit

## 2019-06-07 NOTE — Telephone Encounter (Signed)
Referral/records faxed to [facility] - Release [id number]

## 2019-06-08 ENCOUNTER — Telehealth: Payer: Self-pay | Admitting: *Deleted

## 2019-06-08 NOTE — Telephone Encounter (Signed)
Received call from patient regarding his referral to [facility].  Referral was sent on 06/06/19 and his records were fax'd yesterday, 06/07/19. Advised pt of the above and that I will take a few days for [facility] to receive and  Review his records. Likely they will contact him next week to set up initial consultation.  Advised pt to call us back if he does not hear from them by mid week, next week. Pt voiced understanding

## 2019-06-11 ENCOUNTER — Telehealth: Payer: Self-pay | Admitting: *Deleted

## 2019-06-11 NOTE — Telephone Encounter (Addendum)
Oncology Nurse Navigator Documentation  In follow-up to referral placed 7/22 by Dr. Maylon Peppers to [facility], spoke with Katharine Look [facility] Otolaryngology Physician Referral Line.  She indicated referral had not been received, indicated referrals must be by phone, not by fax.  Coordinated 7/29 1:00 appt with Dr. Conley Canal.  Called Mr. Boyers to inform.  Faxed request to [employer] requesting slides be sent to Dr. Conley Canal.  Notification of successful fax transmission received.  Spoke with [facility], requested 06/04/2019 CT and PET imaging push to [facility].  Rec'd e-mail confirmation of completion.  Faxed to Dr. Jenne Campus attention pertinent records; notification of successful fax transmission received:  05/25/2019 biopsy report  06/04/2019 CT Neck and PET reports  06/06/2019 Progress Note, Dr. Vedia Coffer, Medical Oncology  Gayleen Orem, RN, BSN Head & Neck Oncology Nurse [place] at [place] [phone]

## 2019-06-12 ENCOUNTER — Ambulatory Visit
Admission: RE | Admit: 2019-06-12 | Discharge: 2019-06-12 | Disposition: A | Discharge status: Home / Self Care | Payer: Medicare Other | Source: Ambulatory Visit | Attending: Radiation Oncology | Admitting: Radiation Oncology

## 2019-06-12 ENCOUNTER — Encounter: Payer: Self-pay | Admitting: *Deleted

## 2019-06-12 ENCOUNTER — Other Ambulatory Visit: Payer: Self-pay

## 2019-06-12 ENCOUNTER — Encounter: Payer: Self-pay | Admitting: Radiation Oncology

## 2019-06-12 DIAGNOSIS — C090 Malignant neoplasm of tonsillar fossa: Secondary | ICD-10-CM

## 2019-06-12 NOTE — Progress Notes (Signed)
Oncology Nurse Navigator Documentation  Joined Mr. Vickki Muff during [employer] with Dr. Isidore Moos.   . Provided introductory explanation of radiation treatment including SIM planning and purpose of Aquaplast head and shoulder mask, showed him example.   . Showed/explained PEG. . He voiced understanding of tmt options presented. [name] He agreed to call me after tomorrow's TORS consult at [facility] with tmt decision.  Gayleen Orem, RN, BSN Head & Neck Oncology Nurse [place] at [place] [phone]

## 2019-06-12 NOTE — Progress Notes (Signed)
Radiation Oncology         [phone] ________________________________  Initial Outpatient Consultation (WebEx and then phone when patient's WebEx connectivity failed)  Name: Caleb Hoffman MRN: [id number]  Date: 06/12/2019  DOB: 1951-10-22  CC:[facility], [name]  Alphonsa Overall, MD   REFERRING PHYSICIAN: Alphonsa Overall, MD  DIAGNOSIS:    ICD-10-CM   1. 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 male who presented to his dentist and mentioned having "a flap when he swallowed".  Subsequently, the patient saw Dr. Lucia Gaskins who performed biopsy.  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 The malignant cells are positive for cytokeratin 5/6, p63, and p16.   Pertinent imaging thus far includes PET scan performed on 06/04/2019 revealing: Hypermetabolic left palatine tonsillar mass, maximum SUV 14.5, compatible with malignancy. Adjacent level IIa lymph node measuring 1.3 cm in short axis has a maximum SUV of 9.0, compatible with malignant involvement. No other findings of active malignancy identified.   CT scan of the neck on 06/04/2019 showed: slightly larger than 2 cm left tonsillar mass with ipsilateral solitary jugulodigastric adenopathy measuring 17 mm.   Swallowing issues, if any: He denies.  Weight Changes: He reports losing about 10 lbs in the past two weeks. Wt Readings from Last 3 Encounters:  06/06/19 188 lb 8 oz (85.5 kg)  06/04/19 189 lb (85.7 kg)  02/07/18 208 lb (94.3 kg)   Other symptoms:  10 lb wt loss  Tobacco  history, if any: He has never smoked.  ETOH abuse, if any: He drinks alcohol rarely.   PREVIOUS RADIATION THERAPY: No  PAST MEDICAL HISTORY:  has a past medical history of Anxiety and Hypertension (2010).    PAST SURGICAL HISTORY: Past Surgical History:  Procedure Laterality Date   ANTERIOR CERVICAL DECOMP/DISCECTOMY FUSION  07/13/2012   Procedure: ANTERIOR CERVICAL DECOMPRESSION/DISCECTOMY FUSION 3 LEVELS;  Surgeon: Sinclair Ship, MD;  Location: [place] [facility];  Service: Orthopedics;  Laterality: Right;  Anterior cervical decompression fusion, cervical 4-5, cervical 5-6, cervical 6-7 with instrumentation, allograft.   KNEE ARTHROSCOPY     Left Knee x2.   RHINOPLASTY  1994   deviated septum   TOOTH EXTRACTION  09/2017    FAMILY HISTORY: family history includes Heart disease in his father and mother.  SOCIAL HISTORY:  reports that he has never smoked. He has never used smokeless tobacco. He reports that he does not use drugs.  ALLERGIES: Patient has no known allergies.  MEDICATIONS:  Current Outpatient Medications  Medication Sig Dispense Refill   amLODipine (NORVASC) 10 MG tablet Take 10 mg by mouth daily.     aspirin EC 81 MG tablet Take 81 mg by mouth daily.     COMBIGAN 0.2-0.5 % ophthalmic solution Apply 1 drop to eye daily. 1 drop in Right eye daily.     FLUoxetine (PROZAC) 20 MG capsule Take 20 mg by mouth daily.     gabapentin (NEURONTIN) 300 MG capsule Take 300 mg by mouth 3 (three) times daily.     lisinopril-hydrochlorothiazide (PRINZIDE,ZESTORETIC) 20-12.5 MG per tablet Take 1  tablet by mouth daily.     LORazepam (ATIVAN) 0.5 MG tablet Take 0.5 mg by mouth daily.     Multiple Vitamin (MULTIVITAMIN WITH MINERALS) TABS Take 1 tablet by mouth daily.     sodium fluoride (PREVIDENT 5000 PLUS) 1.1 % CREA dental cream Apply small amount to tooth brush. Brush teeth for 2 minutes. Spit out excess. DO NOT rinse afterwards. Repeat nightly. 1 Tube prn   No current  facility-administered medications for this encounter.     REVIEW OF SYSTEMS:  Notable for that above.   PHYSICAL EXAM:  vitals were not taken for this visit.   General: Alert and oriented, in no acute distress    LABORATORY DATA:  Lab Results  Component Value Date   WBC 8.2 06/04/2019   HGB 15.0 06/04/2019   HCT 43.9 06/04/2019   MCV 90.7 06/04/2019   PLT 264 06/04/2019   CMP     Component Value Date/Time   NA 139 06/04/2019 1042   NA 140 07/20/2017 1117   K 4.7 06/04/2019 1042   CL 103 06/04/2019 1042   CO2 29 06/04/2019 1042   GLUCOSE 104 (H) 06/04/2019 1042   BUN 25 (H) 06/04/2019 1042   BUN 21 07/20/2017 1117   CREATININE 0.90 06/04/2019 1130   CALCIUM 9.4 06/04/2019 1042   PROT 7.3 06/04/2019 1042   PROT 7.1 07/20/2017 1117   ALBUMIN 4.4 06/04/2019 1042   ALBUMIN 4.4 07/20/2017 1117   AST 21 06/04/2019 1042   ALT 21 06/04/2019 1042   ALKPHOS 64 06/04/2019 1042   BILITOT 1.1 06/04/2019 1042   BILITOT 0.2 07/20/2017 1117   GFRNONAA >60 06/04/2019 1042   GFRAA >60 06/04/2019 1042      No results found for: TSH   RADIOGRAPHY: Ct Soft Tissue Neck W Contrast  Result Date: 06/05/2019 CLINICAL DATA:  Left tonsil cancer EXAM: CT NECK WITH CONTRAST TECHNIQUE: Multidetector CT imaging of the neck was performed using the standard protocol following the bolus administration of intravenous contrast. CONTRAST:  68mL OMNIPAQUE IOHEXOL 300 MG/ML  SOLN COMPARISON:  Head CT from the same day FINDINGS: Pharynx and larynx: 2 cm left tonsillar mass causing asymmetric oropharyngeal effacement on the left. When accounting for streak artifact no submucosal tumor invasion. Salivary glands: No inflammation, mass, or stone. Thyroid: Essentially normal Lymph nodes: Enlarged and rounded left jugulodigastric node measuring 17 mm in diameter. No necrosis or gross extracapsular tumor is seen. Vascular: Atherosclerotic calcification Limited intracranial: Negative Visualized orbits: Negative  Mastoids and visualized paranasal sinuses: Clear Skeleton: C4-C7 ACDF with solid arthrodesis Upper chest: Negative IMPRESSION: 2 cm left tonsillar mass with ipsilateral solitary jugulodigastric adenopathy measuring 17 mm. Electronically Signed   By: Monte Fantasia M.D.   On: 06/05/2019 05:05   Nm Pet Image Initial (pi) Skull Base To Thigh  Result Date: 06/04/2019 CLINICAL DATA:  Initial treatment strategy for tonsillar cancer. EXAM: NUCLEAR MEDICINE PET SKULL BASE TO THIGH TECHNIQUE: 10.3 mCi F-18 FDG was injected intravenously. Full-ring PET imaging was performed from the skull base to thigh after the radiotracer. CT data was obtained and used for attenuation correction and anatomic localization. Fasting blood glucose: 92 mg/dl COMPARISON:  CT neck from today. FINDINGS: Mediastinal blood pool activity: SUV max 2.5 Liver activity: SUV max NA NECK: A left palatine tonsillar mass has a maximum SUV of 14.5, compatible with malignancy. In adjacent level IIa lymph node measuring 1.3 cm in short axis on image 39/3 has a maximum SUV of 9.0, compatible with malignant  involvement. There several small level IIb lymph nodes just below this level, the largest of which measures 0.5 cm in short axis as on 44/3 of the concurrent diagnostic CT scan. These have a maximum SUV of 1.8, significantly less than the blood pool. No other pathologic or hypermetabolic adenopathy in the neck is identified. Incidental CT findings: none CHEST: No significant abnormal hypermetabolic activity in this region. Incidental CT findings: Atherosclerotic calcification of the aortic arch. ABDOMEN/PELVIS: No significant abnormal hypermetabolic activity in this region. Incidental CT findings: Large photopenic cyst of the left kidney upper pole. Smaller hypodense lesion of the right mid kidney. Aortoiliac atherosclerotic vascular disease. Left posterior bladder diverticulum. Small amount of free pelvic fluid as on image 536/4, cause uncertain.  SKELETON: No significant abnormal hypermetabolic activity in this region. Incidental CT findings: Cervical spine fusion. Chronic bilateral pars defects at L5. IMPRESSION: 1. Hypermetabolic left palatine tonsillar mass, maximum SUV 14.5, compatible with malignancy. 2. Adjacent level IIa lymph node measuring 1.3 cm in short axis has a maximum SUV of 9.0, compatible with malignant involvement. 3. No other findings of active malignancy identified. 4. Small amount of free pelvic fluid, etiology uncertain. 5. Other imaging findings of potential clinical significance: Aortic Atherosclerosis (ICD10-I70.0). Large left kidney upper pole cyst. Chronic bilateral pars defects at L5. Electronically Signed   By: Van Clines M.D.   On: 06/04/2019 15:45      IMPRESSION/PLAN: This is a delightful patient with head and neck cancer. He may be a good TORS candidate.  He is interested in this. Pending consult w/ Dr Conley Canal at [facility] tomorrow.  Alternatives to surgery would be ChRT or definitive RT (less aggressive approach). We discussed pros and cons of all curative approaches.  I'd estimate a 1:3 chance he will need adjuvant RT despite TORS but welcome Dr Jenne Campus estimate on this as well.  We discussed the potential risks, benefits, and side effects of radiotherapy. We talked in detail about acute and late effects. We discussed that some of the most bothersome acute effects may be mucositis, dysgeusia, salivary changes, skin irritation, hair loss, dehydration, weight loss and fatigue. We talked about late effects which include but are not necessarily limited to dysphagia, hypothyroidism, nerve injury, spinal cord injury, xerostomia, trismus, and neck edema. No guarantees of treatment were given.  We will await patient's decision re: TORS - he knows to call Gayleen Orem, RN, our Head and Neck Oncology Navigator after her consultation at [facility].  This encounter was provided by telemedicine platform Webex and then  phone when his [facility] connectivity failed.  The patient has given verbal consent for this type of encounter and has been advised to only accept a meeting of this type in a secure network environment. The time spent during this encounter was 20 minutes. The attendants for this meeting include Eppie Gibson  and Linward Natal.  During the encounter, Eppie Gibson was located at [name] Radiation Oncology Department.  GRAIG HESSLING was located at home.   __________________________________________   Eppie Gibson, MD  This document serves as a record of services personally performed by Eppie Gibson, MD. It was created on her behalf by Rae Lips, a trained medical scribe. The creation of this record is based on the scribe's personal observations and the provider's statements to them. This document has been checked and approved by the attending provider.

## 2019-06-13 ENCOUNTER — Encounter: Payer: Self-pay | Admitting: Radiation Oncology

## 2019-06-14 ENCOUNTER — Telehealth: Payer: Self-pay | Admitting: *Deleted

## 2019-06-14 NOTE — Telephone Encounter (Signed)
Oncology Nurse Navigator Documentation  Called Mr. Vickki Muff s/p yesterday's TORS consult with Dr. Conley Canal for update.  He reported he is moving forward with surgery, scheduled for 8/18.   He voiced understanding I will coordinate his return to [facility] if adjuvant tmt needed.  Drs. Erasmo Downer and [place] updated.  Gayleen Orem, RN, BSN Head & Neck Oncology Nurse [place] at [place] [phone]

## 2019-06-14 NOTE — Telephone Encounter (Signed)
Thank you for letting me know. Pls keep [place] posted of the path so that we can determine if he requires any adjuvant treatment.  Dr. Maylon Peppers

## 2019-07-27 ENCOUNTER — Telehealth: Payer: Self-pay | Admitting: *Deleted

## 2019-07-27 NOTE — Telephone Encounter (Signed)
Oncology Nurse Navigator Documentation  Called Mr. Vivero to check on his well-being s/p 8/28 TORS with Dr. Conley Canal, [facility].  LVMM requesting return call.  Gayleen Orem, RN, BSN Head & Neck Oncology Nurse [place] at [place] [phone]

## 2019-07-31 ENCOUNTER — Telehealth: Payer: Self-pay | Admitting: *Deleted

## 2019-07-31 NOTE — Telephone Encounter (Signed)
Oncology Nurse Navigator Documentation  Rec'd return call from Mr. Dematteo.  He stated:  Recovering well from 8/28 TORS, primary complaint is sore throat and hoarseness.  Dr. Conley Canal indicated in 9/11 follow-up adjuvant XRT not necessary. I expressed appreciation for update, indicated I wd share with Dr. Isidore Moos. He voiced appreciation for my follow-up.  Gayleen Orem, RN, BSN Head & Neck Oncology Nurse [place] at [place] [phone]

## 2019-08-07 ENCOUNTER — Encounter: Payer: Self-pay | Admitting: *Deleted

## 2019-08-07 NOTE — Progress Notes (Signed)
Oncology Nurse Navigator Documentation  Flowsheet/spreadsheet update.  Rick Diehl, RN, BSN Head & Neck Oncology Nurse Navigator [facility] at [place] [phone]   

## 2019-12-26 ENCOUNTER — Encounter: Payer: Self-pay | Admitting: Cardiovascular Disease

## 2019-12-26 ENCOUNTER — Other Ambulatory Visit: Payer: Self-pay

## 2019-12-26 ENCOUNTER — Ambulatory Visit (INDEPENDENT_AMBULATORY_CARE_PROVIDER_SITE_OTHER): Payer: Medicare Other | Admitting: Cardiovascular Disease

## 2019-12-26 VITALS — BP 107/73 | HR 56 | Temp 98.1°F | Ht 72.0 in | Wt 193.2 lb

## 2019-12-26 DIAGNOSIS — G4730 Sleep apnea, unspecified: Secondary | ICD-10-CM | POA: Diagnosis not present

## 2019-12-26 DIAGNOSIS — I10 Essential (primary) hypertension: Secondary | ICD-10-CM

## 2019-12-26 DIAGNOSIS — I700 Atherosclerosis of aorta: Secondary | ICD-10-CM | POA: Diagnosis not present

## 2019-12-26 DIAGNOSIS — G4761 Periodic limb movement disorder: Secondary | ICD-10-CM

## 2019-12-26 DIAGNOSIS — C090 Malignant neoplasm of tonsillar fossa: Secondary | ICD-10-CM

## 2019-12-26 NOTE — Progress Notes (Signed)
Cardiology Office Note    Date:  12/26/2019   ID:  Caleb Hoffman, Caleb Hoffman 03-09-1951, MRN [id number]  PCP:  [facility]  Cardiologist:  Shelva Majestic, MD   New cardiology evaluation referred through the courtesy of Dr. Edmonia Caprio  History of Present Illness:  Caleb Hoffman is a 69 y.o. male who is referred through the courtesy of Dr. Sela Hilding for evaluation of aortic atherosclerosis.  Mr. Tanori has a history of hypertension, hyperlipidemia, cervical fusion of C3-7, and has a history of  squamous cell carcinoma of the left tonsil status post left neck dissection. On recent PET imaging was detected that there was evidence for aortic atherosclerosis. The patient had recently seen Dr. Lindell Noe. He denies any episodes of chest pain or significant shortness of breath. He exercises 1 hour at a time without difficulty and typically walks. Because of concerns of the recently found aortic atherosclerosis he presents now for cardiology evaluation.  He has been on amlodipine 10 mg in addition to lisinopril HCT 20/12.5 mg for hypertension. He also is on Prozac and Ativan for anxiety. Recent laboratory on December 10, 2019 that showed a total cholesterol 192, triglycerides 71, HDL 66 and LDL cholesterol 113. He presents for evaluation.   Past Medical History:  Diagnosis Date  . Anxiety   . Hypertension 2010    Past Surgical History:  Procedure Laterality Date  . ANTERIOR CERVICAL DECOMP/DISCECTOMY FUSION  07/13/2012   Procedure: ANTERIOR CERVICAL DECOMPRESSION/DISCECTOMY FUSION 3 LEVELS;  Surgeon: Sinclair Ship, MD;  Location: [place];  Service: Orthopedics;  Laterality: Right;  Anterior cervical decompression fusion, cervical 4-5, cervical 5-6, cervical 6-7 with instrumentation, allograft.  [name] KNEE ARTHROSCOPY     Left Knee x2.  [name] RHINOPLASTY  1994   deviated septum  . TOOTH EXTRACTION  09/2017    Current Medications: Outpatient Medications  Prior to Visit  Medication Sig Dispense Refill  . amLODipine (NORVASC) 10 MG tablet Take 10 mg by mouth daily.    [name] aspirin EC 81 MG tablet Take 81 mg by mouth daily.    . COMBIGAN 0.2-0.5 % ophthalmic solution Apply 1 drop to eye daily. 1 drop in Right eye daily.    [name] FLUoxetine (PROZAC) 20 MG capsule Take 20 mg by mouth daily.    [name] gabapentin (NEURONTIN) 300 MG capsule Take 300 mg by mouth 3 (three) times daily.    [name] lisinopril-hydrochlorothiazide (PRINZIDE,ZESTORETIC) 20-12.5 MG per tablet Take 1 tablet by mouth daily.    [name] LORazepam (ATIVAN) 0.5 MG tablet Take 0.5 mg by mouth daily.    . Multiple Vitamin (MULTIVITAMIN WITH MINERALS) TABS Take 1 tablet by mouth daily.    . sodium fluoride (PREVIDENT 5000 PLUS) 1.1 % CREA dental cream Apply small amount to tooth brush. Brush teeth for 2 minutes. Spit out excess. DO NOT rinse afterwards. Repeat nightly. 1 Tube prn   No facility-administered medications prior to visit.     Allergies:   Patient has no known allergies.   Social History   Socioeconomic History  . Marital status: Married    Spouse name: Not on file  . Number of children: 2  . Years of education: Not on file  . Highest education level: Not on file  Occupational History  . Not on file  Tobacco Use  . Smoking status: Never Smoker  . Smokeless tobacco: Never Used  Substance and Sexual Activity  . Alcohol use: Not on file    Comment:  Rare  . Drug use: Never  . Sexual activity: Not on file  Other Topics Concern  . Not on file  Social History Narrative  . Not on file   Social Determinants of Health   Financial Resource Strain:   . Difficulty of Paying Living Expenses: Not on file  Food Insecurity:   . Worried About Charity fundraiser in the Last Year: Not on file  . Ran Out of Food in the Last Year: Not on file  Transportation Needs: No Transportation Needs  . Lack of Transportation (Medical): No  . Lack of Transportation (Non-Medical): No  Physical Activity:    . Days of Exercise per Week: Not on file  . Minutes of Exercise per Session: Not on file  Stress:   . Feeling of Stress : Not on file  Social Connections:   . Frequency of Communication with Friends and Family: Not on file  . Frequency of Social Gatherings with Friends and Family: Not on file  . Attends Religious Services: Not on file  . Active Member of Clubs or Organizations: Not on file  . Attends Archivist Meetings: Not on file  . Marital Status: Not on file     Socially he was born in [place]. He entered [employer] at age 63 and retired at age 18. He is married for 36 years. He completed 2 years of college. He works in [place] for [employer] There is no tobacco history. There is no recent alcohol use and remotely would have perhaps 1-2 drinks per month. He exercises at least 5 days/week for up to an hour doing both weights and walking.  Family History:  The patient's family history includes Heart disease in his father and mother.  His mother died at age 36 and had hypertension and evaluate valve surgery. Father died at age 23 and had hypertension. He has 3 living brothers ages 36, 54 and 34 with hypertension. One sister age 27 has hypertension. He has 2 children ages 43 and 33.  ROS General: Negative; No fevers, chills, or night sweats;  HEENT: Negative; No changes in vision or hearing, sinus congestion, difficulty swallowing Positive for left tonsillar cancer, status post left radical neck lymph node dissection Pulmonary: Negative; No cough, wheezing, shortness of breath, hemoptysis Cardiovascular: Negative; No chest pain, presyncope, syncope, palpitations GI: Negative; No nausea, vomiting, diarrhea, or abdominal pain GU: Negative; No dysuria, hematuria, or difficulty voiding Musculoskeletal: History of cervical fusion 2013 Hematologic/Oncology: Negative; no easy bruising, bleeding Endocrine: Negative; no heat/cold intolerance; no diabetes Neuro:  Negative; no changes in balance, headaches Skin: Negative; No rashes or skin lesions Psychiatric: Negative; No behavioral problems, depression Sleep: Remote history of very mild sleep apnea apparently failed treatment with an oral appliance. History of restless legs; no bruxism, hypnogognic hallucinations, no cataplexy Other comprehensive 14 point system review is negative.   PHYSICAL EXAM:   VS:  BP 107/73 Comment: left arm  Pulse (!) 56   Temp 98.1 F (36.7 C)   Ht 6' (1.829 m)   Wt 193 lb 3.2 oz (87.6 kg)   SpO2 97%   BMI 26.20 kg/m     Repeat blood pressure by me in the right arm 126/74 in the left arm 124/72  Wt Readings from Last 3 Encounters:  12/26/19 193 lb 3.2 oz (87.6 kg)  06/06/19 188 lb 8 oz (85.5 kg)  06/04/19 189 lb (85.7 kg)    General: Alert, oriented, no distress.  Skin:  normal turgor, no rashes, warm and dry HEENT: Normocephalic, atraumatic. Pupils equal round and reactive to light; sclera anicteric; extraocular muscles intact; Nose without nasal septal hypertrophy Mouth/Parynx benign; Mallinpatti scale Neck: Status post left neck dissection;no JVD, no carotid bruits; normal carotid upstroke Lungs: clear to ausculatation and percussion; no wheezing or rales Chest wall: without tenderness to palpitation Heart: PMI not displaced, RRR, s1 s2 normal, ERXVQ0/0 systolic murmur, no diastolic murmur, no rubs, gallops, thrills, or heaves Abdomen: soft, nontender; no hepatosplenomehaly, BS+; abdominal aorta nontender and not dilated by palpation. Back: no CVA tenderness Pulses 2+ Musculoskeletal: full range of motion, normal strength, no joint deformities Extremities: no clubbing cyanosis or edema, Homan's sign negative  Neurologic: grossly nonfocal; Cranial nerves grossly wnl Psychologic: Normal mood and affect   Studies/Labs Reviewed:   EKG:  EKG is ordered today.  ECG (independently read by me): Sinus Bradycardia at 55; no ectopy and normal intervals  Recent  Labs: BMP Latest Ref Rng & Units 06/04/2019 06/04/2019 07/20/2017  Glucose 70 - 99 mg/dL - 104(H) 92  BUN 8 - 23 mg/dL - 25(H) 21  Creatinine 0.61 - 1.24 mg/dL 0.90 0.97 1.02  BUN/Creat Ratio 10 - 24 - - 21  Sodium 135 - 145 mmol/L - 139 140  Potassium 3.5 - 5.1 mmol/L - 4.7 4.3  Chloride 98 - 111 mmol/L - 103 101  CO2 22 - 32 mmol/L - 29 23  Calcium 8.9 - 10.3 mg/dL - 9.4 9.6     Hepatic Function Latest Ref Rng & Units 06/04/2019 07/20/2017 07/10/2012  Total Protein 6.5 - 8.1 g/dL 7.3 7.1 7.1  Albumin 3.5 - 5.0 g/dL 4.4 4.4 3.9  AST 15 - 41 U/L _0 ALT 0 - 44 U/L _1 Alk Phosphatase 38 - 126 U/L 64 74 64  Total Bilirubin 0.3 - 1.2 mg/dL 1.1 0.2 0.3    CBC Latest Ref Rng & Units 06/04/2019 07/10/2012  WBC 4.0 - 10.5 K/uL 8.2 13.9(H)  Hemoglobin 13.0 - 17.0 g/dL 15.0 15.1  Hematocrit 39.0 - 52.0 % 43.9 43.3  Platelets 150 - 400 K/uL 264 248   Lab Results  Component Value Date   MCV 90.7 06/04/2019   MCV 89.8 07/10/2012   No results found for: TSH No results found for: HGBA1C   BNP No results found for: BNP  ProBNP No results found for: PROBNP   Lipid Panel  No results found for: CHOL, TRIG, HDL, CHOLHDL, VLDL, LDLCALC, LDLDIRECT, LABVLDL   RADIOLOGY: No results found.   Additional studies/ records that were reviewed today include:  I reviewed records from Dr. Lindell Noe. Records were also reviewed from Dr. Eppie Gibson, MRI images were reviewed   ASSESSMENT:    No diagnosis found.   PLAN:  Caleb Hoffman is a 69 year old gentleman who has a history of hypertension, mild hyperlipidemia left tonsillar squamous cell carcinoma status post surgery with neck dissection as well as history of cervical fusion of C3-C7. He is asymptomatic with reference to any chest pain or exertional dyspnea. He is unaware of any palpitations. He denies presyncope or syncope. Recent MRI scan has demonstrated aortic atherosclerosis. I discussed with him data with reference to  subclinical atherosclerosis. I am recommending he undergo an echo Doppler study to evaluate systolic and diastolic function as well as valvular architecture. We discussed options for evaluation of potential coronary artery disease., I recommended initial screening with a CT cardiac scoring study rather than routine treadmill testing or exercise  nuclear imaging. If he does have evidence for coronary calcification, aggressive lipid-lowering therapy will be recommended with a target LDL of less than 70 in attempt to induce plaque regression. If his calcium score is significant we discussed undergone coronary CTA evaluation to further delineate his coronary anatomy with reference to obstructive disease. His blood pressure today is controlled on his medical regimen consisting of amlodipine 10 mg as well as lisinopril HCTZ 20/12.5 mg. He is on gabapentin for restless legs. He had spent 20 years in [employer] and experienced PTSD for which he has been on fluoxetine in addition to Ativan. There is a remote history of mild sleep apnea and failed an attempt remotely over oral appliance.  I will see him in 4 to 6 weeks for follow-up evaluation and further recommendations will be made at that time.   Medication Adjustments/Labs and Tests Ordered: Current medicines are reviewed at length with the patient today.  Concerns regarding medicines are outlined above.  Medication changes, Labs and Tests ordered today are listed in the Patient Instructions below. There are no Patient Instructions on file for this visit.   Signed, Shelva Majestic, MD  12/26/2019 11:11 AM    [facility] [address], [place], [place], [place]  [address] Phone: [phone]

## 2019-12-26 NOTE — Patient Instructions (Signed)
Medication Instructions:  CONTINUE WITH CURRENT MEDICATIONS. NO CHANGES. *If you need a refill on your cardiac medications before your next appointment, please call your pharmacy*  Testing/Procedures: Your physician has requested that you have an echocardiogram. Echocardiography is a painless test that uses sound waves to create images of your heart. It provides your doctor with information about the size and shape of your heart and how well your heart's chambers and valves are working. This procedure takes approximately one hour. There are no restrictions for this procedure.  [place]  CT Cardiac Scoring- instructions below  Follow-Up: At [facility], you and your health needs are our priority.  As part of our continuing mission to provide you with exceptional heart care, we have created designated Provider Care Teams.  These Care Teams include your primary Cardiologist (physician) and Advanced Practice Providers (APPs -  Physician Assistants and Nurse Practitioners) who all work together to provide you with the care you need, when you need it.  Your next appointment:  After the tests (echo and CT cardiac scoring) 4-6 week(s)  The format for your next appointment:   In Person  Provider:   Shelva Majestic, MD      [facility] has ordered a CT coronary calcium score. This test is done at [address]. This is $150 out of pocket.   Coronary CalciumScan A coronary calcium scan is an imaging test used to look for deposits of calcium and other fatty materials (plaques) in the inner lining of the blood vessels of the heart (coronary arteries). These deposits of calcium and plaques can partly clog and narrow the coronary arteries without producing any symptoms or warning signs. This puts a person at risk for a heart attack. This test can detect these deposits before symptoms develop. Tell a health care provider about:  Any allergies you have.  All medicines  you are taking, including vitamins, herbs, eye drops, creams, and over-the-counter medicines.  Any problems you or family members have had with anesthetic medicines.  Any blood disorders you have.  Any surgeries you have had.  Any medical conditions you have.  Whether you are pregnant or may be pregnant. What are the risks? Generally, this is a safe procedure. However, problems may occur, including:  Harm to a pregnant woman and her unborn baby. This test involves the use of radiation. Radiation exposure can be dangerous to a pregnant woman and her unborn baby. If you are pregnant, you generally should not have this procedure done.  Slight increase in the risk of cancer. This is because of the radiation involved in the test. What happens before the procedure? No preparation is needed for this procedure. What happens during the procedure?  You will undress and remove any jewelry around your neck or chest.  You will put on a hospital gown.  Sticky electrodes will be placed on your chest. The electrodes will be connected to an electrocardiogram (ECG) machine to record a tracing of the electrical activity of your heart.  A CT scanner will take pictures of your heart. During this time, you will be asked to lie still and hold your breath for 2-3 seconds while a picture of your heart is being taken. The procedure may vary among health care providers and hospitals. What happens after the procedure?  You can get dressed.  You can return to your normal activities.  It is up to you to get the results of your test. Ask your health  care provider, or the department that is doing the test, when your results will be ready. Summary  A coronary calcium scan is an imaging test used to look for deposits of calcium and other fatty materials (plaques) in the inner lining of the blood vessels of the heart (coronary arteries).  Generally, this is a safe procedure. Tell your health care provider if  you are pregnant or may be pregnant.  No preparation is needed for this procedure.  A CT scanner will take pictures of your heart.  You can return to your normal activities after the scan is done. This information is not intended to replace advice given to you by your health care provider. Make sure you discuss any questions you have with your health care provider. Document Released: 04/29/2008 Document Revised: 09/20/2016 Document Reviewed: 09/20/2016 Elsevier Interactive Patient Education  2017 [employer].

## 2020-01-01 ENCOUNTER — Encounter: Payer: Self-pay | Admitting: Cardiovascular Disease

## 2020-01-06 ENCOUNTER — Ambulatory Visit: Payer: Medicare Other | Attending: Internal Medicine

## 2020-01-06 DIAGNOSIS — Z23 Encounter for immunization: Secondary | ICD-10-CM

## 2020-01-06 NOTE — Progress Notes (Signed)
   Covid-19 Vaccination Clinic  Name:  Caleb Hoffman    MRN: [id number] DOB: 1951/11/07  01/06/2020  Mr. Caleb Hoffman was observed post Covid-19 immunization for 15 minutes without incidence. He was provided with Vaccine Information Sheet and instruction to access the V-Safe system.   Mr. Caleb Hoffman was instructed to call 911 with any severe reactions post vaccine: [name] Difficulty breathing  . Swelling of your face and throat  . A fast heartbeat  . A bad rash all over your body  . Dizziness and weakness    Immunizations Administered    Name Date Dose VIS Date Route   Pfizer COVID-19 Vaccine 01/06/2020  1:26 PM 0.3 mL 10/26/2019 Intramuscular   Manufacturer: [place]   Lot: [id number]   [place]: [id number]

## 2020-01-09 ENCOUNTER — Ambulatory Visit (HOSPITAL_COMMUNITY): Payer: Medicare Other | Attending: Cardiovascular Disease

## 2020-01-09 ENCOUNTER — Other Ambulatory Visit: Payer: Self-pay

## 2020-01-09 ENCOUNTER — Ambulatory Visit (INDEPENDENT_AMBULATORY_CARE_PROVIDER_SITE_OTHER)
Admission: RE | Admit: 2020-01-09 | Discharge: 2020-01-09 | Disposition: A | Discharge status: Home / Self Care | Payer: Self-pay | Source: Ambulatory Visit | Attending: Cardiovascular Disease | Admitting: Cardiovascular Disease

## 2020-01-09 DIAGNOSIS — I10 Essential (primary) hypertension: Secondary | ICD-10-CM | POA: Insufficient documentation

## 2020-01-09 DIAGNOSIS — E785 Hyperlipidemia, unspecified: Secondary | ICD-10-CM | POA: Insufficient documentation

## 2020-01-09 DIAGNOSIS — I700 Atherosclerosis of aorta: Secondary | ICD-10-CM | POA: Diagnosis present

## 2020-01-30 ENCOUNTER — Ambulatory Visit: Payer: Medicare Other | Attending: Internal Medicine

## 2020-01-30 DIAGNOSIS — Z23 Encounter for immunization: Secondary | ICD-10-CM

## 2020-01-30 NOTE — Progress Notes (Signed)
   Covid-19 Vaccination Clinic  Name:  Caleb Hoffman    MRN: [id number] DOB: July 18, 1951  01/30/2020  Caleb Hoffman was observed post Covid-19 immunization for 15 minutes without incident. He was provided with Vaccine Information Sheet and instruction to access the V-Safe system.   Caleb Hoffman was instructed to call 911 with any severe reactions post vaccine: [name] Difficulty breathing  . Swelling of face and throat  . A fast heartbeat  . A bad rash all over body  . Dizziness and weakness   Immunizations Administered    Name Date Dose VIS Date Route   Pfizer COVID-19 Vaccine 01/30/2020 11:58 AM 0.3 mL 10/26/2019 Intramuscular   Manufacturer: [place]   Lot: [id number]   [place]: [id number]

## 2020-02-05 ENCOUNTER — Encounter: Payer: Self-pay | Admitting: Cardiovascular Disease

## 2020-02-05 ENCOUNTER — Other Ambulatory Visit: Payer: Self-pay

## 2020-02-05 ENCOUNTER — Ambulatory Visit (INDEPENDENT_AMBULATORY_CARE_PROVIDER_SITE_OTHER): Payer: Medicare Other | Admitting: Cardiovascular Disease

## 2020-02-05 VITALS — BP 128/76 | HR 53 | Temp 97.3°F | Ht 72.0 in | Wt 194.0 lb

## 2020-02-05 DIAGNOSIS — I700 Atherosclerosis of aorta: Secondary | ICD-10-CM | POA: Diagnosis not present

## 2020-02-05 DIAGNOSIS — I10 Essential (primary) hypertension: Secondary | ICD-10-CM | POA: Diagnosis not present

## 2020-02-05 MED ORDER — ROSUVASTATIN CALCIUM 10 MG PO TABS: 10.0000 mg | ORAL_TABLET | Freq: Every day | ORAL | 3 refills | Status: DC

## 2020-02-05 NOTE — Patient Instructions (Addendum)
Medication Instructions:  BEGIN TAKING ROSUVASTATIN 10 MG DAILY  *If you need a refill on your cardiac medications before your next appointment, please call your pharmacy*   Lab Work: FASTING CMET AND LIPID  If you have labs (blood work) drawn today and your tests are completely normal, you will receive your results only by: [name] MyChart Message (if you have MyChart) OR . A paper copy in the mail If you have any lab test that is abnormal or we need to change your treatment, we will call you to review the results.  Follow-Up: At [facility], you and your health needs are our priority.  As part of our continuing mission to provide you with exceptional heart care, we have created designated Provider Care Teams.  These Care Teams include your primary Cardiologist (physician) and Advanced Practice Providers (APPs -  Physician Assistants and Nurse Practitioners) who all work together to provide you with the care you need, when you need it.  We recommend signing up for the patient portal called "MyChart".  Sign up information is provided on this After Visit Summary.  MyChart is used to connect with patients for Virtual Visits (Telemedicine).  Patients are able to view lab/test results, encounter notes, upcoming appointments, etc.  Non-urgent messages can be sent to your provider as well.   To learn more about what you can do with MyChart, go to [email or web address].    Your next appointment:   4 month(s)  The format for your next appointment:   In Person  Provider:   Shelva Majestic, MD

## 2020-02-05 NOTE — Progress Notes (Signed)
Cardiology Office Note    Date:  02/05/2020   ID:  Caleb, Hoffman [birth year], MRN [id number]  PCP:  Glenis Smoker, MD  Cardiologist:  Shelva Majestic, MD   F/U cardiology evaluation referred through the courtesy of Dr. Edmonia Caprio  History of Present Illness:  Caleb Hoffman is a 69 y.o. male who was referred through the courtesy of Dr. Sela Hilding for evaluation of aortic atherosclerosis.  I saw him for initial evaluation on December 26, 2019.  He presents now for 6-week follow-up evaluation.  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 On recent PET imaging was detected that there was evidence for aortic atherosclerosis. The patient had recently seen Dr. Lindell Noe. He denies any episodes of chest pain or significant shortness of breath. He exercises 1 hour at a time without difficulty and typically walks. Because of concerns of the recently found aortic atherosclerosis I saw him in February 2021 for initial cardiology evaluation.  At that time he was on amlodipine 10 mg in addition to lisinopril HCT 20/12.5 mg for hypertension. He also was on Prozac and Ativan for anxiety. Recent laboratory on December 10, 2019 that showed a total cholesterol 192, triglycerides 71, HDL 66 and LDL cholesterol 113.  During that evaluation, I discussed options for potentially evaluating coronary artery disease.  He was asymptomatic and did not have any episodes of chest pain.  I recommended initial screening with a CT cardiac scoring study rather than routine treadmill testing or exercise nuclear imaging.  His blood pressure was controlled on his medical regimen.  He had spent 20 years in [employer] and had experienced PTSD for which he was on fluoxetine in addition to Ativan.  He had a remote history of mild sleep apnea and failed an attempt with an oral  appliance.  At the time he felt he was sleeping well.  Since I saw him, he underwent coronary calcium score which was mildly increased at 24, placing him in the 42nd percentile for age and sex matched control.  He had undergone laboratory at [facility] on December 10, 2019 which showed a total cholesterol 192, triglycerides 71 HDL 66 and LDL cholesterol 113.  He had normal renal function and LFTs.  He presents for reevaluation.  Past Medical History:  Diagnosis Date  . Anxiety   . Hypertension 2010    Past Surgical History:  Procedure Laterality Date  . ANTERIOR CERVICAL DECOMP/DISCECTOMY FUSION  07/13/2012   Procedure: ANTERIOR CERVICAL DECOMPRESSION/DISCECTOMY FUSION 3 LEVELS;  Surgeon: Sinclair Ship, MD;  Location: [facility];  Service: Orthopedics;  Laterality: Right;  Anterior cervical decompression fusion, cervical 4-5, cervical 5-6, cervical 6-7 with instrumentation, allograft.  [name] KNEE ARTHROSCOPY     Left Knee x2.  [name] RHINOPLASTY  1994   deviated septum  . TOOTH EXTRACTION  09/2017    Current Medications: Outpatient Medications Prior to Visit  Medication Sig Dispense Refill  . amLODipine (NORVASC) 10 MG tablet Take 10 mg by mouth daily.    [name] aspirin EC 81 MG tablet Take 81 mg by mouth daily.    . COMBIGAN 0.2-0.5 % ophthalmic solution Apply 1 drop to eye daily. 1 drop in Right eye daily.    [name] FLUoxetine (PROZAC) 20 MG capsule Take 20 mg by mouth daily.    [name] gabapentin (NEURONTIN) 300 MG capsule Take  300 mg by mouth 3 (three) times daily.    [name] lisinopril-hydrochlorothiazide (PRINZIDE,ZESTORETIC) 20-12.5 MG per tablet Take 1 tablet by mouth daily.    [name] LORazepam (ATIVAN) 0.5 MG tablet Take 0.5 mg by mouth daily.    . Multiple Vitamin (MULTIVITAMIN WITH MINERALS) TABS Take 1 tablet by mouth daily.    [name] PEG 3350-KCl-NaBcb-NaCl-NaSulf (PEG-3350/ELECTROLYTES) 236 g SOLR MIX AND DRINK AS DIRECTED     No facility-administered medications prior to visit.     Allergies:    Patient has no known allergies.   Social History   Socioeconomic History  . Marital status: Married    Spouse name: Not on file  . Number of children: 2  . Years of education: Not on file  . Highest education level: Not on file  Occupational History  . Not on file  Tobacco Use  . Smoking status: Never Smoker  . Smokeless tobacco: Never Used  Substance and Sexual Activity  . Alcohol use: Not on file    Comment: Rare  . Drug use: Never  . Sexual activity: Not on file  Other Topics Concern  . Not on file  Social History Narrative  . Not on file   Social Determinants of Health   Financial Resource Strain:   . Difficulty of Paying Living Expenses:   Food Insecurity:   . Worried About Charity fundraiser in the Last Year:   . Arboriculturist in the Last Year:   Transportation Needs: No Transportation Needs  . Lack of Transportation (Medical): No  . Lack of Transportation (Non-Medical): No  Physical Activity:   . Days of Exercise per Week:   . Minutes of Exercise per Session:   Stress:   . Feeling of Stress :   Social Connections:   . Frequency of Communication with Friends and Family:   . Frequency of Social Gatherings with Friends and Family:   . Attends Religious Services:   . Active Member of Clubs or Organizations:   . Attends Archivist Meetings:   [name] Marital Status:      Socially he was born in [place]. He entered [employer] at age 44 and retired at age 58. He is married for 36 years. He completed 2 years of college. He works in [place] for [employer] There is no tobacco history. There is no recent alcohol use and remotely would have perhaps 1-2 drinks per month. He exercises at least 5 days/week for up to an hour doing both weights and walking.  Family History:  The patient's family history includes Heart disease in his father and mother.  His mother died at age 34 and had hypertension and evaluate valve surgery. Father died at age  71 and had hypertension. He has 3 living brothers ages 20, 49 and 32 with hypertension. One sister age 96 has hypertension. He has 2 children ages 58 and 20.  ROS General: Negative; No fevers, chills, or night sweats;  HEENT: Negative; No changes in vision or hearing, sinus congestion, difficulty swallowing Positive for left tonsillar cancer, status post left radical neck lymph node dissection Pulmonary: Negative; No cough, wheezing, shortness of breath, hemoptysis Cardiovascular: Negative; No chest pain, presyncope, syncope, palpitations GI: Negative; No nausea, vomiting, diarrhea, or abdominal pain GU: Negative; No dysuria, hematuria, or difficulty voiding Musculoskeletal: History of cervical fusion 2013 Hematologic/Oncology: Negative; no easy bruising, bleeding Endocrine: Negative; no heat/cold intolerance; no diabetes Neuro: Negative; no changes in balance, headaches Skin: Negative;  No rashes or skin lesions Psychiatric: He had served in [employer] for 20 years and developed PTSD for which she has been on fluoxetine and lorazepam. Sleep: Remote history of very mild sleep apnea apparently failed treatment with an oral appliance. History of restless legs; no bruxism, hypnogognic hallucinations, no cataplexy Other comprehensive 14 point system review is negative.   PHYSICAL EXAM:   VS:  BP 128/76   Pulse (!) 53   Temp (!) 97.3 F (36.3 C)   Ht 6' (1.829 m)   Wt 194 lb (88 kg)   SpO2 97%   BMI 26.31 kg/m     Repeat blood pressure by me 124/74  Wt Readings from Last 3 Encounters:  02/05/20 194 lb (88 kg)  12/26/19 193 lb 3.2 oz (87.6 kg)  06/06/19 188 lb 8 oz (85.5 kg)     General: Alert, oriented, no distress.  Skin: normal turgor, no rashes, warm and dry HEENT: Normocephalic, atraumatic. Pupils equal round and reactive to light; sclera anicteric; extraocular muscles intact;  Nose without nasal septal hypertrophy Mouth/Parynx benign; Mallinpatti scale 2 Neck: No JVD, no  carotid bruits; normal carotid upstroke Lungs: clear to ausculatation and percussion; no wheezing or rales Chest wall: without tenderness to palpitation Heart: PMI not displaced, RRR, s1 s2 normal, 1/6 systolic murmur, no diastolic murmur, no rubs, gallops, thrills, or heaves Abdomen: soft, nontender; no hepatosplenomehaly, BS+; abdominal aorta nontender and not dilated by palpation. Back: no CVA tenderness Pulses 2+ Musculoskeletal: full range of motion, normal strength, no joint deformities Extremities: no clubbing cyanosis or edema, Homan's sign negative  Neurologic: grossly nonfocal; Cranial nerves grossly wnl Psychologic: Normal mood and affect    Studies/Labs Reviewed:   EKG:  EKG is ordered today.  Sinus bradycardia 53 bpm.  No ectopy.  Normal intervals.  No ST segment changes.  ECG (independently read by me): Sinus Bradycardia at 55; no ectopy and normal intervals  Recent Labs: BMP Latest Ref Rng & Units 06/04/2019 06/04/2019 07/20/2017  Glucose 70 - 99 mg/dL - 104(H) 92  BUN 8 - 23 mg/dL - 25(H) 21  Creatinine 0.61 - 1.24 mg/dL 0.90 0.97 1.02  BUN/Creat Ratio 10 - 24 - - 21  Sodium 135 - 145 mmol/L - 139 140  Potassium 3.5 - 5.1 mmol/L - 4.7 4.3  Chloride 98 - 111 mmol/L - 103 101  CO2 22 - 32 mmol/L - 29 23  Calcium 8.9 - 10.3 mg/dL - 9.4 9.6     Hepatic Function Latest Ref Rng & Units 06/04/2019 07/20/2017 07/10/2012  Total Protein 6.5 - 8.1 g/dL 7.3 7.1 7.1  Albumin 3.5 - 5.0 g/dL 4.4 4.4 3.9  AST 15 - 41 U/L 21 22 16   ALT 0 - 44 U/L 21 22 30   Alk Phosphatase 38 - 126 U/L 64 74 64  Total Bilirubin 0.3 - 1.2 mg/dL 1.1 0.2 0.3    CBC Latest Ref Rng & Units 06/04/2019 07/10/2012  WBC 4.0 - 10.5 K/uL 8.2 13.9(H)  Hemoglobin 13.0 - 17.0 g/dL 15.0 15.1  Hematocrit 39.0 - 52.0 % 43.9 43.3  Platelets 150 - 400 K/uL 264 248   Lab Results  Component Value Date   MCV 90.7 06/04/2019   MCV 89.8 07/10/2012   No results found for: TSH No results found for:  HGBA1C   BNP No results found for: BNP  ProBNP No results found for: PROBNP   Lipid Panel  No results found for: CHOL, TRIG, HDL, CHOLHDL, VLDL, LDLCALC, LDLDIRECT,  LABVLDL   RADIOLOGY: CT CARDIAC SCORING  Addendum Date: 01/11/2020   ADDENDUM REPORT: 01/11/2020 13:48 CLINICAL DATA:  Risk stratification EXAM: Coronary Calcium Score TECHNIQUE: The patient was scanned on a [employer]. Axial non-contrast 3 mm slices were carried out through the heart. The data set was analyzed on a dedicated work station and scored using the [place]. FINDINGS: Non-cardiac: See separate report from [facility]. Ascending Aorta: Normal size, measures up to 57m Pericardium: Normal Coronary arteries: Coronary calcium score of 52 IMPRESSION: Coronary calcium score of 52. This was 466percentile for age and sex matched control. Electronically Signed   By: COswaldo MilianMD   On: 01/11/2020 13:48   Result Date: 01/11/2020 EXAM: OVER-READ INTERPRETATION  CT CHEST The following report is an over-read performed by radiologist Dr. DVinnie Langtonof [facility], PMount Crawfordon 01/09/2020. This over-read does not include interpretation of cardiac or coronary anatomy or pathology. The coronary calcium score interpretation by the cardiologist is attached. COMPARISON:  None. FINDINGS: Within the visualized portions of the thorax there are no suspicious appearing pulmonary nodules or masses, there is no acute consolidative airspace disease, no pleural effusions, no pneumothorax and no lymphadenopathy. Visualized portions of the upper abdomen are unremarkable. There are no aggressive appearing lytic or blastic lesions noted in the visualized portions of the skeleton. IMPRESSION: No significant incidental noncardiac findings are noted. Electronically Signed: By: DVinnie LangtonM.D. On: 01/09/2020 10:39   ECHOCARDIOGRAM COMPLETE  Result Date: 01/09/2020    ECHOCARDIOGRAM REPORT   Patient Name:    JALP GOLDWATERDate of Exam: 01/09/2020 Medical Rec #:  [id number]       Height:       72.0 in Accession #:    [id number]      Weight:       193.2 lb Date of Birth:  6September 20, 1952        BSA:          2.099 m Patient Age:    89years         BP:           107/73 mmHg Patient Gender: M                HR:           56 bpm. [facility]  [facility] 2[facility] Echo, Cardiac Doppler and Color Doppler Indications:    I70.0  History:        Patient has no prior history of Echocardiogram examinations.                 Aortic atherosclerosis; Risk Factors:Hypertension and                 Dyslipidemia.  Sonographer:    WCoralyn HellingRDCS Referring Phys: 4[place] 1. Left ventricular ejection fraction, by estimation, is 60 to 65%. The left ventricle has normal function. The left ventricle has no regional wall motion abnormalities. Left ventricular diastolic parameters were normal.  2. Right ventricular systolic function is normal. The right ventricular size is normal.  3. The mitral valve is normal in structure and function. No evidence of mitral valve regurgitation. No evidence of mitral stenosis.  4. The aortic valve is tricuspid. Aortic valve regurgitation is not visualized. No aortic stenosis is present.  5. The inferior vena cava is normal in size with greater than 50% respiratory variability, suggesting right atrial pressure of 3 mmHg. Comparison(s):  No prior Echocardiogram. Conclusion(s)/Recommendation(s): Normal biventricular function without evidence of hemodynamically significant valvular heart disease. FINDINGS  Left Ventricle: Left ventricular ejection fraction, by estimation, is 60 to 65%. The left ventricle has normal function. The left ventricle has no regional wall motion abnormalities. The left ventricular internal cavity size was normal in size. There is  no left ventricular hypertrophy. Left ventricular diastolic parameters were normal. Right Ventricle: The right ventricular  size is normal. No increase in right ventricular wall thickness. Right ventricular systolic function is normal. Left Atrium: Left atrial size was normal in size. Right Atrium: Right atrial size was normal in size. Pericardium: There is no evidence of pericardial effusion. Mitral Valve: The mitral valve is normal in structure and function. No evidence of mitral valve regurgitation. No evidence of mitral valve stenosis. Tricuspid Valve: The tricuspid valve is normal in structure. Tricuspid valve regurgitation is trivial. No evidence of tricuspid stenosis. Aortic Valve: The aortic valve is tricuspid. Aortic valve regurgitation is not visualized. No aortic stenosis is present. Pulmonic Valve: The pulmonic valve was grossly normal. Pulmonic valve regurgitation is not visualized. No evidence of pulmonic stenosis. Aorta: The aortic root, ascending aorta and aortic arch are all structurally normal, with no evidence of dilitation or obstruction. Pulmonary Artery: The pulmonary artery is of normal size. Venous: The inferior vena cava is normal in size with greater than 50% respiratory variability, suggesting right atrial pressure of 3 mmHg. IAS/Shunts: No atrial level shunt detected by color flow Doppler.  LEFT VENTRICLE PLAX 2D LVIDd:         4.70 cm  Diastology LVIDs:         2.60 cm  LV e' lateral:   10.40 cm/s LV PW:         1.00 cm  LV E/e' lateral: 9.2 LV IVS:        0.80 cm  LV e' medial:    8.16 cm/s LVOT diam:     1.90 cm  LV E/e' medial:  11.7 LV SV:         81 LV SV Index:   39 LVOT Area:     2.84 cm  RIGHT VENTRICLE             IVC RV S prime:     13.10 cm/s  IVC diam: 1.40 cm TAPSE (M-mode): 2.6 cm LEFT ATRIUM             Index       RIGHT ATRIUM           Index LA diam:        3.10 cm 1.48 cm/m  RA Pressure: 3.00 mmHg LA Vol (A2C):   48.9 ml 23.29 ml/m RA Area:     18.50 cm LA Vol (A4C):   60.5 ml 28.82 ml/m RA Volume:   50.70 ml  24.15 ml/m LA Biplane Vol: 60.3 ml 28.72 ml/m  AORTIC VALVE LVOT Vmax:    126.00 cm/s LVOT Vmean:  81.100 cm/s LVOT VTI:    0.286 m  AORTA Ao Root diam: 3.30 cm Ao Asc diam:  3.60 cm MV E velocity: 95.50 cm/s  TRICUSPID VALVE MV A velocity: 81.00 cm/s  Estimated RAP:  3.00 mmHg MV E/A ratio:  1.18                            SHUNTS  Systemic VTI:  0.29 m                            Systemic Diam: 1.90 cm Buford Dresser MD Electronically signed by Buford Dresser MD Signature Date/Time: 01/09/2020/11:39:22 PM    Final      Additional studies/ records that were reviewed today include:  I reviewed records from Dr. Lindell Noe. Records were also reviewed from Dr. Eppie Gibson, MRI images were reviewed   ASSESSMENT:    No diagnosis found.   PLAN:  Mr. Damek Ende is a 69 year old gentleman who has a history of hypertension, mild hyperlipidemia, left tonsillar squamous cell carcinoma status post surgery with neck dissection as well as history of cervical fusion of C3-C7. He is asymptomatic with reference to any chest pain or exertional dyspnea. He is unaware of any palpitations. He denies presyncope or syncope. A recent MRI scan has demonstrated aortic atherosclerosis.  When I initially evaluated him I discussed data regarding subclinical atherosclerosis.  He underwent a cardiac CT scoring study which showed mild coronary calcification score of 52 placing him in the 42nd percentile for age and sex max control.  With his evidence for mild coronary plaque, I have recommended more aggressive lipid management with target LDL less than 70 in attempt to induce plaque regression and prevent progression.  I discussed numerous clinical trials and lipid-lowering data and after much discussion I will initiate rosuvastatin at 10 mg daily.  Hopefully this will induce at least a 30-40% reduction to allow him to achieve a goal of less than 70.  We discussed if he were to develop symptoms further evaluation with coronary CTA can be performed.  Presently his  blood pressure is stable on amlodipine 10 mg and lisinopril HCT 20/12.5 mg.  He is on baby aspirin 81 mg for antiplatelet benefit.  His PTSD is controlled with fluoxetine and lorazepam.  He is on gabapentin for neuropathy.  I have recommended follow-up laboratory in 4 months and I will see him in follow-up and further recommendations will be made at that time   Medication Adjustments/Labs and Tests Ordered: Current medicines are reviewed at length with the patient today.  Concerns regarding medicines are outlined above.  Medication changes, Labs and Tests ordered today are listed in the Patient Instructions below. There are no Patient Instructions on file for this visit.   Signed, Shelva Majestic, MD  02/05/2020 2:16 PM    [facility] [address], [place], [place], [place]  [address] Phone: [phone]

## 2020-02-07 ENCOUNTER — Encounter: Payer: Self-pay | Admitting: Cardiovascular Disease

## 2020-03-19 ENCOUNTER — Ambulatory Visit: Payer: Medicare Other | Admitting: Cardiovascular Disease

## 2020-08-08 ENCOUNTER — Telehealth: Payer: Self-pay | Admitting: Cardiovascular Disease

## 2020-08-08 NOTE — Telephone Encounter (Signed)
Spoke to patient, aware due for CMET and Lipid. Aware of lab hours, will get this completed prior to OV

## 2020-08-08 NOTE — Telephone Encounter (Signed)
Does patient need to have labs done prior to his appt on 09/01/20? There are orders from March, im not sure if that's what he needs to have done. Please advise.

## 2020-08-22 LAB — COMPREHENSIVE METABOLIC PANEL
ALT: 20 IU/L (ref 0–44)
AST: 18 IU/L (ref 0–40)
Albumin/Globulin Ratio: 2.2 (ref 1.2–2.2)
Albumin: 4.3 g/dL (ref 3.8–4.8)
Alkaline Phosphatase: 77 IU/L (ref 44–121)
BUN/Creatinine Ratio: 25 — ABNORMAL HIGH (ref 10–24)
BUN: 24 mg/dL (ref 8–27)
Bilirubin Total: 0.5 mg/dL (ref 0.0–1.2)
CO2: 24 mmol/L (ref 20–29)
Calcium: 9.1 mg/dL (ref 8.6–10.2)
Chloride: 103 mmol/L (ref 96–106)
Creatinine, Ser: 0.95 mg/dL (ref 0.76–1.27)
GFR calc Af Amer: 95 mL/min/{1.73_m2} (ref >59)
GFR calc non Af Amer: 82 mL/min/{1.73_m2} (ref >59)
Globulin, Total: 2 g/dL (ref 1.5–4.5)
Glucose: 91 mg/dL (ref 65–99)
Potassium: 4.3 mmol/L (ref 3.5–5.2)
Sodium: 139 mmol/L (ref 134–144)
Total Protein: 6.3 g/dL (ref 6.0–8.5)

## 2020-08-22 LAB — LIPID PANEL
Chol/HDL Ratio: 1.8 ratio (ref 0.0–5.0)
Cholesterol, Total: 125 mg/dL (ref 100–199)
HDL: 71 mg/dL (ref >39)
LDL Chol Calc (NIH): 43 mg/dL (ref 0–99)
Triglycerides: 42 mg/dL (ref 0–149)
VLDL Cholesterol Cal: 11 mg/dL (ref 5–40)

## 2020-08-27 ENCOUNTER — Other Ambulatory Visit: Payer: Self-pay

## 2020-08-27 ENCOUNTER — Ambulatory Visit (INDEPENDENT_AMBULATORY_CARE_PROVIDER_SITE_OTHER): Payer: Medicare Other | Admitting: Physician Assistant

## 2020-08-27 ENCOUNTER — Encounter: Payer: Self-pay | Admitting: Physician Assistant

## 2020-08-27 VITALS — BP 124/68 | HR 56 | Ht 72.0 in | Wt 182.0 lb

## 2020-08-27 DIAGNOSIS — I2584 Coronary atherosclerosis due to calcified coronary lesion: Secondary | ICD-10-CM | POA: Diagnosis not present

## 2020-08-27 DIAGNOSIS — E785 Hyperlipidemia, unspecified: Secondary | ICD-10-CM

## 2020-08-27 DIAGNOSIS — I2510 Atherosclerotic heart disease of native coronary artery without angina pectoris: Secondary | ICD-10-CM | POA: Diagnosis not present

## 2020-08-27 DIAGNOSIS — I10 Essential (primary) hypertension: Secondary | ICD-10-CM | POA: Diagnosis not present

## 2020-08-27 NOTE — Patient Instructions (Addendum)
Medication Instructions:  Your physician recommends that you continue on your current medications as directed. Please refer to the Current Medication list given to you today.  *If you need a refill on your cardiac medications before your next appointment, please call your pharmacy*  Lab Work: NONE ordered at this time of appointment   If you have labs (blood work) drawn today and your tests are completely normal, you will receive your results only by: [name] MyChart Message (if you have MyChart) OR . A paper copy in the mail If you have any lab test that is abnormal or we need to change your treatment, we will call you to review the results.  Testing/Procedures: NONE ordered at this time of appointment   Follow-Up: At [facility], you and your health needs are our priority.  As part of our continuing mission to provide you with exceptional heart care, we have created designated Provider Care Teams.  These Care Teams include your primary Cardiologist (physician) and Advanced Practice Providers (APPs -  Physician Assistants and Nurse Practitioners) who all work together to provide you with the care you need, when you need it.  Your next appointment:   9 month(s)  The format for your next appointment:   In Person  Provider:   Shelva Majestic, MD  Other Instructions

## 2020-08-27 NOTE — Progress Notes (Signed)
Cardiology Office Note:    Date:  08/29/2020   ID:  Caleb Hoffman, DOB 30-Oct-1951, MRN [id number]  PCP:  Caleb Smoker, MD  Caleb Hoffman Cardiologist:  Caleb Majestic, MD  [place] Electrophysiologist:  None   Referring MD: Caleb Hoffman, *   Chief Complaint  Patient presents with  . Follow-up    seen for Dr. Claiborne Hoffman    History of Present Illness:    Caleb Hoffman is a 69 y.o. 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  Previous PET imaging detected evidence for aortic atherosclerosis.  Echocardiogram obtained on 01/09/2020 showed EF 60 to 65%, no regional wall motion abnormality, no significant valve issue.  He underwent coronary calcium scoring in early 2021 which placed him at 42nd percentile for age and sex matched control.  Given evidence for mild coronary plaque, Dr. Claiborne Hoffman recommended more aggressive lipid management with target LDL of less than 70.  [name] presents today for cardiology follow-up.  He denies any recent chest pain or worsening shortness of breath.  We reviewed his most recent lab work from 08/22/2020.  Renal function and electrolyte were normal.  Total cholesterol 125, HDL 71, triglyceride 42, LDL 43.  He is doing well on the current Crestor and denies any recent joint ache or muscle ache.  He recently had cold-like symptoms for 2 days however resolved without any issue.  Overall, he is doing quite well from cardiology perspective and can follow-up in 9 months with Dr. Claiborne Hoffman.   Past Medical History:  Diagnosis Date  . Anxiety   . Hypertension 2010    Past Surgical History:  Procedure Laterality Date  . ANTERIOR CERVICAL DECOMP/DISCECTOMY FUSION  07/13/2012   Procedure: ANTERIOR CERVICAL DECOMPRESSION/DISCECTOMY FUSION 3 LEVELS;  Surgeon: Caleb Ship, MD;  Location: [facility];  Service: Orthopedics;  Laterality: Right;  Anterior  cervical decompression fusion, cervical 4-5, cervical 5-6, cervical 6-7 with instrumentation, allograft.  [name] KNEE ARTHROSCOPY     Left Knee x2.  [name] RHINOPLASTY  1994   deviated septum  . TOOTH EXTRACTION  09/2017    Current Medications: Current Meds  Medication Sig  . amLODipine (NORVASC) 10 MG tablet Take 10 mg by mouth daily.  [name] aspirin EC 81 MG tablet Take 81 mg by mouth daily.  . COMBIGAN 0.2-0.5 % ophthalmic solution Apply 1 drop to eye daily. 1 drop in Right eye morning and night.  [name] FLUoxetine (PROZAC) 20 MG capsule Take 20 mg by mouth daily.  [name] gabapentin (NEURONTIN) 300 MG capsule Take 300 mg by mouth 3 (three) times daily.  [name] lisinopril-hydrochlorothiazide (PRINZIDE,ZESTORETIC) 20-12.5 MG per tablet Take 1 tablet by mouth daily.  [name] LORazepam (ATIVAN) 0.5 MG tablet Take 0.5 mg by mouth daily.  . Multiple Vitamin (MULTIVITAMIN WITH MINERALS) TABS Take 1 tablet by mouth daily.  . rosuvastatin (CRESTOR) 10 MG tablet Take 1 tablet (10 mg total) by mouth daily.     Allergies:   Patient has no known allergies.   Social History   Socioeconomic History  . Marital status: Married    Spouse name: Not on file  . Number of children: 2  . Years of education: Not on file  . Highest education level: Not on file  Occupational History  . Not on file  Tobacco Use  . Smoking status: Never Hoffman  . Smokeless tobacco: Never Used  Vaping Use  . Vaping  Use: Never used  Substance and Sexual Activity  . Alcohol use: Not on file    Comment: Rare  . Drug use: Never  . Sexual activity: Not on file  Other Topics Concern  . Not on file  Social History Narrative  . Not on file   Social Determinants of Health   Financial Resource Strain:   . Difficulty of Paying Living Expenses: Not on file  Food Insecurity:   . Worried About Charity fundraiser in the Last Year: Not on file  . Ran Out of Food in the Last Year: Not on file  Transportation Needs:   . Lack of Transportation (Medical):  Not on file  . Lack of Transportation (Non-Medical): Not on file  Physical Activity:   . Days of Exercise per Week: Not on file  . Minutes of Exercise per Session: Not on file  Stress:   . Feeling of Stress : Not on file  Social Connections:   . Frequency of Communication with Friends and Family: Not on file  . Frequency of Social Gatherings with Friends and Family: Not on file  . Attends Religious Services: Not on file  . Active Member of Clubs or Organizations: Not on file  . Attends Archivist Meetings: Not on file  . Marital Status: Not on file     Family History: The patient's family history includes Heart disease in his father and mother.  ROS:   Please see the history of present illness.     All other systems reviewed and are negative.  EKGs/Labs/Other Studies Reviewed:    The following studies were reviewed today:  Echo 01/09/2020 1. Left ventricular ejection fraction, by estimation, is 60 to 65%. The  left ventricle has normal function. The left ventricle has no regional  wall motion abnormalities. Left ventricular diastolic parameters were  normal.  2. Right ventricular systolic function is normal. The right ventricular  size is normal.  3. The mitral valve is normal in structure and function. No evidence of  mitral valve regurgitation. No evidence of mitral stenosis.  4. The aortic valve is tricuspid. Aortic valve regurgitation is not  visualized. No aortic stenosis is present.  5. The inferior vena cava is normal in size with greater than 50%  respiratory variability, suggesting right atrial pressure of 3 mmHg.   EKG:  EKG is not ordered today.    Recent Labs: 08/22/2020: ALT 20; BUN 24; Creatinine, Ser 0.95; Potassium 4.3; Sodium 139  Recent Lipid Panel    Component Value Date/Time   CHOL 125 08/22/2020 0919   TRIG 42 08/22/2020 0919   HDL 71 08/22/2020 0919   CHOLHDL 1.8 08/22/2020 0919   LDLCALC 43 08/22/2020 0919     Risk  Assessment/Calculations:       Physical Exam:    VS:  BP 124/68   Pulse (!) 56   Ht 6' (1.829 m)   Wt 182 lb (82.6 kg)   BMI 24.68 kg/m     Wt Readings from Last 3 Encounters:  08/27/20 182 lb (82.6 kg)  02/05/20 194 lb (88 kg)  12/26/19 193 lb 3.2 oz (87.6 kg)     GEN:  Well nourished, well developed in no acute distress HEENT: Normal NECK: No JVD; No carotid bruits LYMPHATICS: No lymphadenopathy CARDIAC: RRR, no murmurs, rubs, gallops RESPIRATORY:  Clear to auscultation without rales, wheezing or rhonchi  ABDOMEN: Soft, non-tender, non-distended MUSCULOSKELETAL:  No edema; No deformity  SKIN: Warm and dry NEUROLOGIC:  Alert and oriented x 3 PSYCHIATRIC:  Normal affect   ASSESSMENT:    1. Coronary artery calcification   2. Essential hypertension   3. Hyperlipidemia LDL goal <70    PLAN:    In order of problems listed above:  1. Coronary artery calcification: Denies any recent chest discomfort.  Continue aspirin and statin  2. Hypertension: Blood pressure stable  3. Hyperlipidemia: Recent lab work shows controlled total cholesterol, HDL, and LDL.   Medication Adjustments/Labs and Tests Ordered: Current medicines are reviewed at length with the patient today.  Concerns regarding medicines are outlined above.  No orders of the defined types were placed in this encounter.  No orders of the defined types were placed in this encounter.   Patient Instructions  Medication Instructions:  Your physician recommends that you continue on your current medications as directed. Please refer to the Current Medication list given to you today.  *If you need a refill on your cardiac medications before your next appointment, please call your pharmacy*  Lab Work: NONE ordered at this time of appointment   If you have labs (blood work) drawn today and your tests are completely normal, you will receive your results only by: [name] MyChart Message (if you have MyChart) OR . A  paper copy in the mail If you have any lab test that is abnormal or we need to change your treatment, we will call you to review the results.  Testing/Procedures: NONE ordered at this time of appointment   Follow-Up: At [facility], you and your health needs are our priority.  As part of our continuing mission to provide you with exceptional heart care, we have created designated Provider Care Teams.  These Care Teams include your primary Cardiologist (physician) and Advanced Practice Providers (APPs -  Physician Assistants and Nurse Practitioners) who all work together to provide you with the care you need, when you need it.  Your next appointment:   9 month(s)  The format for your next appointment:   In Person  Provider:   Shelva Majestic, MD  Other Instructions      Signed, Almyra Deforest, [place]  08/29/2020 8:27 PM    [place]

## 2020-08-29 ENCOUNTER — Encounter: Payer: Self-pay | Admitting: Physician Assistant

## 2020-09-01 ENCOUNTER — Ambulatory Visit: Payer: Medicare Other | Admitting: Cardiovascular Disease

## 2020-09-11 ENCOUNTER — Telehealth: Payer: Self-pay | Admitting: Cardiovascular Disease

## 2020-09-11 NOTE — Telephone Encounter (Signed)
° ° ° °  Pt is returning call Alisha's call but he said he will just check his mychart

## 2020-10-05 IMAGING — CT CT NECK WITH CONTRAST
4 of 5 series · 14 of 33 positions shown, 17 images · IV contrast (omnipaque)
Comparison: Head CT from the same day

CLINICAL DATA: Left tonsil cancer

EXAM:
CT NECK WITH CONTRAST
TECHNIQUE: Multidetector CT imaging of the neck was performed using the
standard protocol following the bolus administration of intravenous
contrast.
CONTRAST:  75mL OMNIPAQUE IOHEXOL 300 MG/ML  SOLN

[Series 5: thins (person_name) · axial · 0.55mm/px · z∈[+407,+574]mm · 5 of 251 slices shown, 7 images]
[im 42/251  soft-tissue]
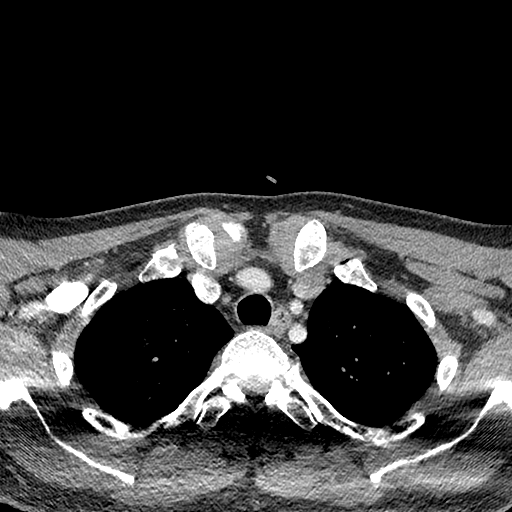
[im 42/251  bone]
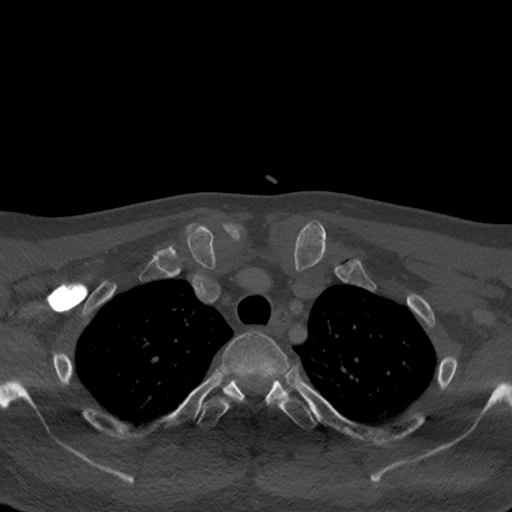
[im 84/251  bone]
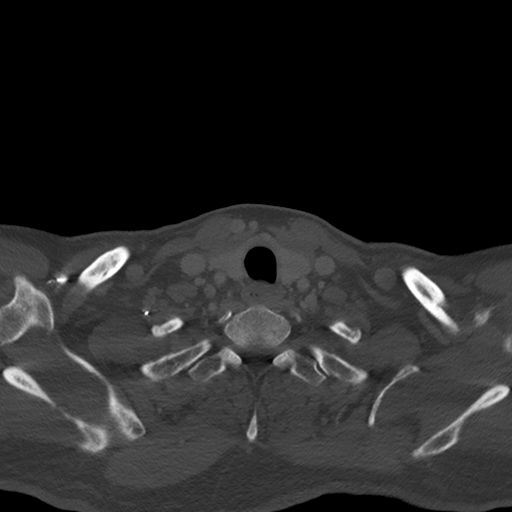
[im 126/251  bone]
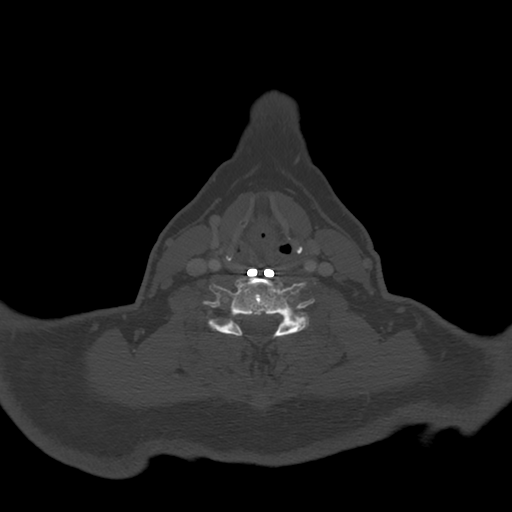
[im 167/251  bone]
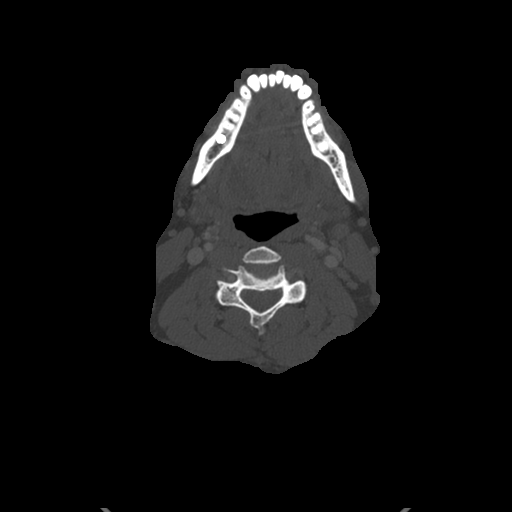
[im 209/251  soft-tissue]
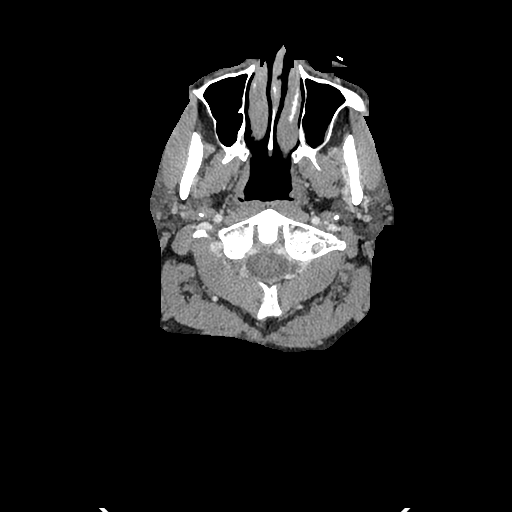
[im 209/251  bone]
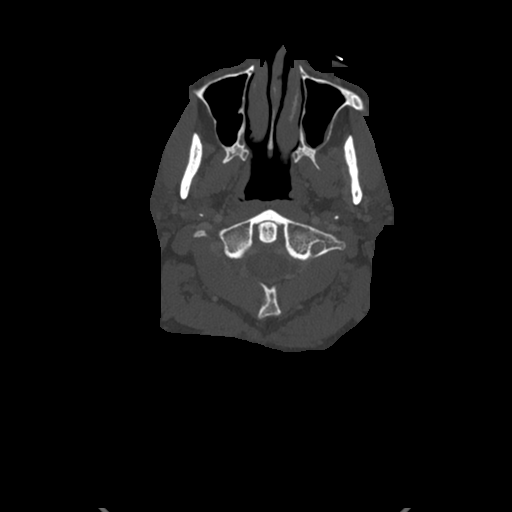

[Series 6: sag neck · sagittal · 0.50mm/px · 5 of 97 slices shown, 6 images]
[im 33/97  bone]
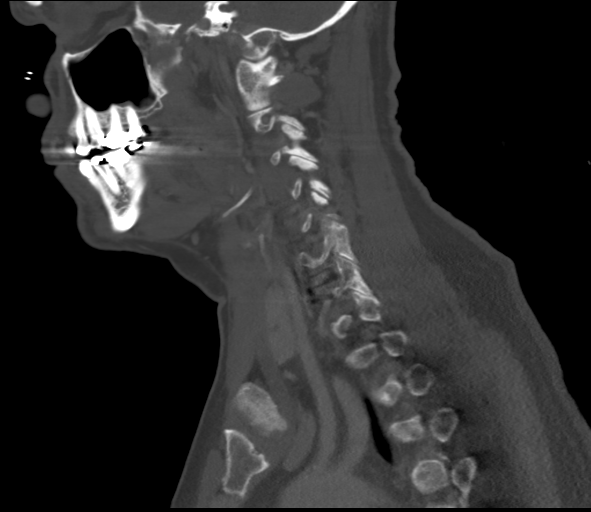
[im 41/97  bone]
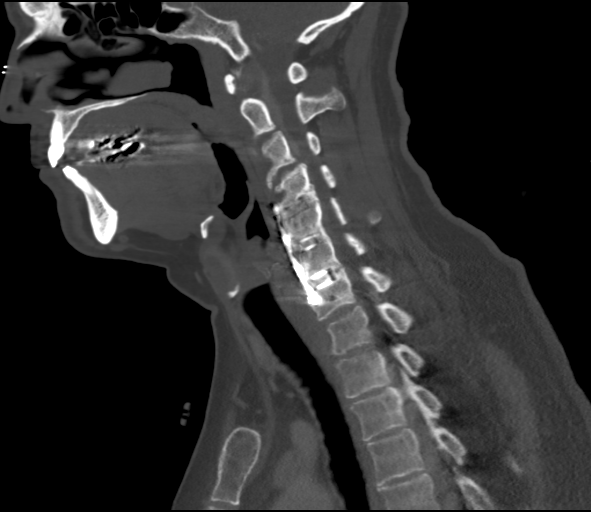
[im 49/97  soft-tissue]
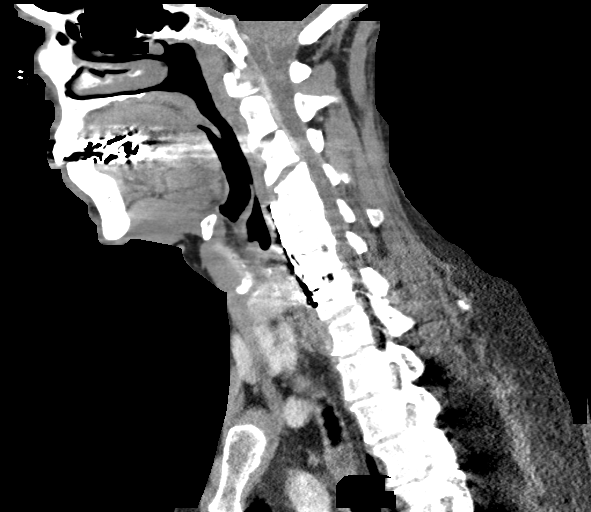
[im 49/97  bone]
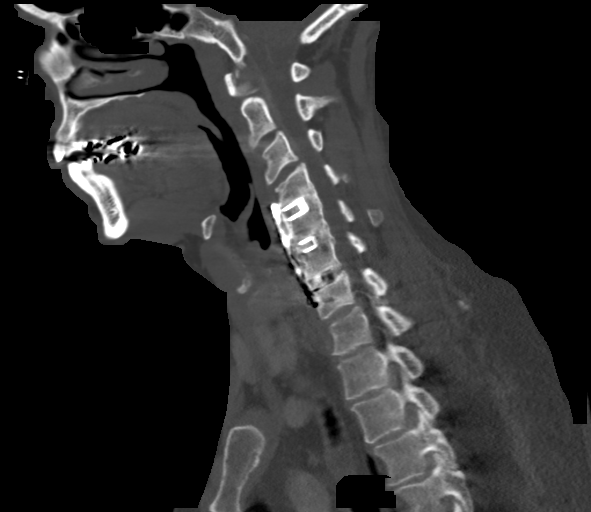
[im 57/97  bone]
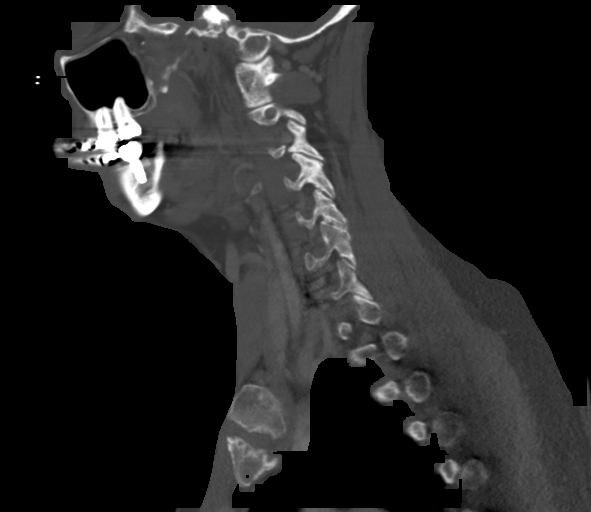
[im 65/97  bone]
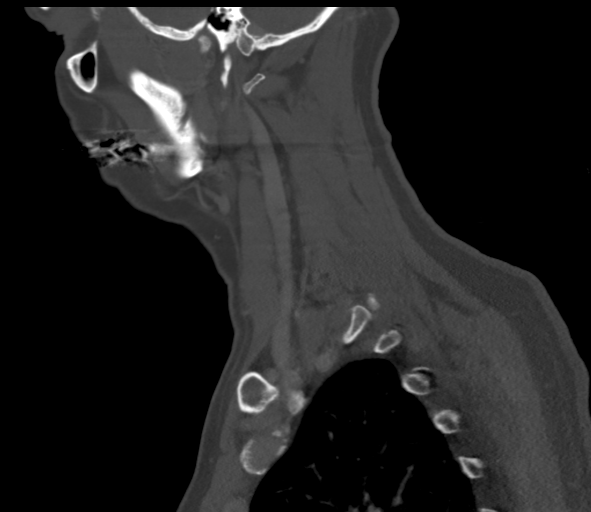

[Series 7: cor neck · coronal · 0.41mm/px · 3 of 137 slices shown]
[im 28/137  bone]
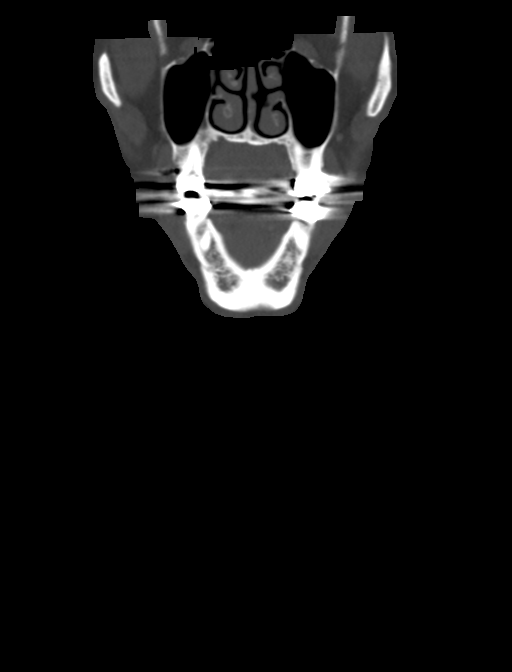
[im 55/137  bone]
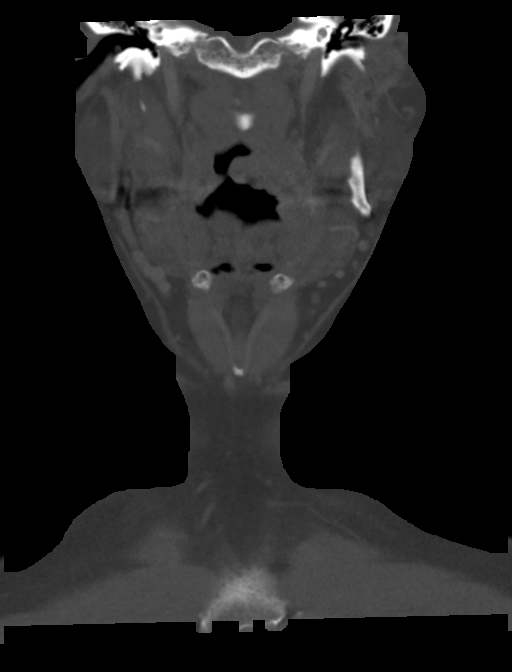
[im 82/137  bone]
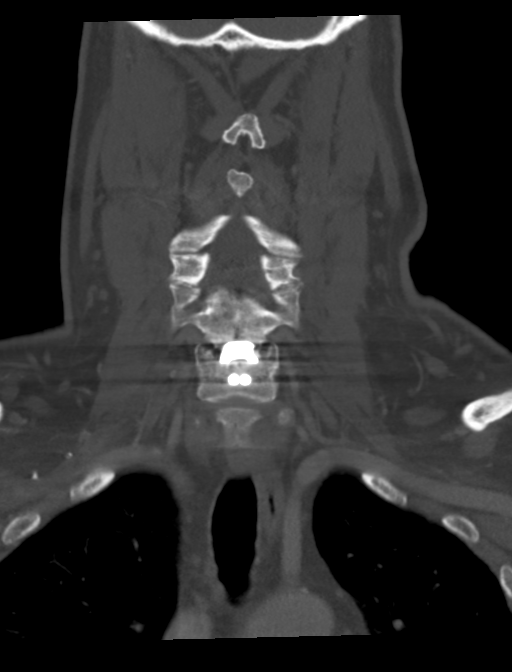

[Series 8: orthogonal ax · axial · 0.50mm/px · 1 of 127 slices shown]
[im 43/127  bone]
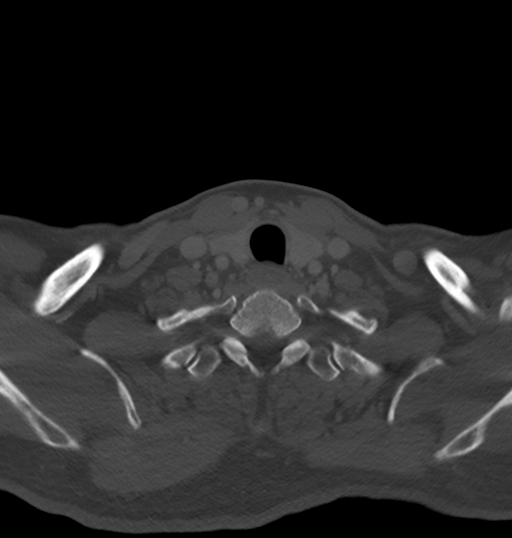

[14 of 33 positions shown; findings below may reference images not displayed]

FINDINGS: Pharynx and larynx: 2 cm left tonsillar mass causing asymmetric
oropharyngeal effacement on the left. When accounting for streak
artifact no submucosal tumor invasion.

Salivary glands: No inflammation, mass, or stone.

Thyroid: Essentially normal

Lymph nodes: Enlarged and rounded left jugulodigastric node
measuring 17 mm in diameter. No necrosis or gross extracapsular
tumor is seen.

Vascular: Atherosclerotic calcification

Limited intracranial: Negative

Visualized orbits: Negative

Mastoids and visualized paranasal sinuses: Clear

Skeleton: C4-C7 ACDF with solid arthrodesis

Upper chest: Negative
IMPRESSION: 2 cm left tonsillar mass with ipsilateral solitary jugulodigastric
adenopathy measuring 17 mm.

## 2020-11-06 ENCOUNTER — Other Ambulatory Visit: Payer: Self-pay | Admitting: Cardiovascular Disease

## 2021-07-06 ENCOUNTER — Telehealth: Payer: Self-pay | Admitting: Cardiovascular Disease

## 2021-07-06 NOTE — Telephone Encounter (Signed)
   Name: Caleb Hoffman  DOB: [birth year]  MRN: [id number]   Primary Cardiologist: Shelva Majestic, MD  Chart reviewed as part of pre-operative protocol coverage. Patient was contacted 07/06/2021 in reference to pre-operative risk assessment for pending surgery as outlined below.  Caleb Hoffman was last seen 08/2020 by Almyra Deforest PA-C with pertinent CV history including elevated calcium score, HTN, anxiety, tonsillar cancer, aortic atherosclerosis. Echo 2021 showed normal EF. I reached out to patient for update on how he is doing. The patient affirms he has been doing well without any new cardiac symptoms. Exercise 7 days at the gym without angina or dyspnea. Therefore, based on ACC/AHA guidelines, the patient would be at acceptable risk for the planned procedure without further cardiovascular testing. The patient was advised that if he develops new symptoms prior to surgery to contact our office to arrange for a follow-up visit, and he verbalized understanding.  It is not explicitly outlined on clearance whether patient needs to hold ASA but if the surgeon deems this necessary, there is no contraindication to holding for procedure from cardiac standpoint.  I will route this recommendation to the requesting party via Epic fax function and remove from pre-op pool. Please call with questions.  Charlie Pitter, PA-C 07/06/2021, 3:50 PM

## 2021-07-06 NOTE — Telephone Encounter (Signed)
   [facility] Pre-operative Risk Assessment    Patient Name: Caleb Hoffman  DOB: 14-Jun-1951 MRN: [id number]  HEARTCARE STAFF:  - IMPORTANT!!!!!! Under Visit Info/Reason for Call, type in Other and utilize the format Clearance MM/DD/YY or Clearance TBD. Do not use dashes or single digits. - Please review there is not already an duplicate clearance open for this procedure. - If request is for dental extraction, please clarify the # of teeth to be extracted. - If the patient is currently at the dentist's office, call Pre-Op Callback Staff (MA/nurse) to input urgent request.  - If the patient is not currently in the dentist office, please route to the Pre-Op pool.  Request for surgical clearance:  What type of surgery is being performed? Left shoulder scope  When is this surgery scheduled? 08-06-21  What type of clearance is required (medical clearance vs. Pharmacy clearance to hold med vs. Both)? both  Are there any medications that need to be held prior to surgery and how long?   Practice name and name of physician performing surgery? Dr Melrose Nakayama  What is the office phone number? [phone]   7.   What is the office fax number? [phone]  8.   Anesthesia type (None, local, MAC, general) ? Choice   Caleb Hoffman 07/06/2021, 2:43 PM  _________________________________________________________________   (provider comments below)

## 2021-08-06 HISTORY — PX: SHOULDER SURGERY: SHX246

## 2022-01-17 ENCOUNTER — Other Ambulatory Visit: Payer: Self-pay | Admitting: Cardiovascular Disease

## 2022-02-16 ENCOUNTER — Encounter: Payer: Self-pay | Admitting: Neurology

## 2022-02-16 ENCOUNTER — Ambulatory Visit (INDEPENDENT_AMBULATORY_CARE_PROVIDER_SITE_OTHER): Payer: Medicare Other | Admitting: Neurology

## 2022-02-16 VITALS — BP 135/76 | HR 62 | Ht 72.0 in | Wt 182.5 lb

## 2022-02-16 DIAGNOSIS — Z8659 Personal history of other mental and behavioral disorders: Secondary | ICD-10-CM

## 2022-02-16 DIAGNOSIS — R413 Other amnesia: Secondary | ICD-10-CM | POA: Diagnosis not present

## 2022-02-16 NOTE — Progress Notes (Signed)
? ? ?  ? ?Provider:  [name], MD  ?Primary Care Physician:  Glenis Smoker, MD ?[address] ?[name] [address]  ? ?  ?Referring Provider: Glenis Smoker, Md ?[address] ?[place],  [place] [address]  ?  ?  ?    ?Chief Complaint according to patient   ?Patient presents with:  ?  ? New Patient (Initial Visit)  ?   Rm 10, alone. [facility] [place]. ?Pt referred by Sela Hilding, MD for memory loss. Pt has short term memory loss within the last year and not getting any better per pt. MOCA:26/30.  ?  ?  ?HISTORY OF PRESENT ILLNESS:  ?Caleb Hoffman is a 71 y.o. Caucasian male patient seen here upon a referral on 02/16/2022 from PCP for a memory evaluation.  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 Also lymph nodes of left neck removed. This cured his snoring and apnea. He has never needed CPAP. He has been released from the sleep clinic 01-2018.  ? ?VERE DIANTONIO  has a past medical history of Anxiety, Hypertension (11/15/2008), and Throat cancer ([place])..  ? ? 02-16-2022: Chief concern according to patient : " I am more forgetful, easier distracted and  more impatient ". ? 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  ?Non smoker,  drinker- rare. Caffeine 1 cup a day.  ?Sleeps 8 ours a day/ no longer snoring ?  ?Memory lapses are reported, check book balancing : no problem. ? Not getting lost when driving.  ?No trouble with pin numbers, but has passwords written down.  ?Appointments have to be written down, keeping a wall calendar.  ?Trouble recalling conversations, with wife.  ?Impulsive behavior , pressure to speak, jumping thought. had learning disability, may have had ADHD. 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   ? ?  ? ?PET SCAN : IMPRESSION: ?1. Hypermetabolic left palatine tonsillar mass,  maximum SUV 14.5, ?compatible with malignancy. ?2. Adjacent level IIa lymph node measuring 1.3 cm in short axis has ?a maximum SUV of 9.0, compatible with malignant involvement. ?3. No other findings of active malignancy identified. ?4. Small amount of free pelvic fluid, etiology uncertain. ?5. Other imaging findings of potential clinical significance: Aortic ?Atherosclerosis (ICD10-I70.0). Large left kidney upper pole cyst. ?Chronic bilateral pars defects at L5. ?  ?  ?Electronically Signed ?  By: Van Clines M.D. ?  On: 06/04/2019 15:45 ?  ? ?Review of Systems: ?Out of a complete 14 system review, the patient complains of only the following symptoms, and all other reviewed systems are negative.:  ? ?See above . ? ?Social History  ? ?Socioeconomic History  ? Marital status: Married  ?  Spouse name: Vonzella Nipple  ? Number of children: 2  ? Years of education: Not on file  ? Highest education level: Associate degree: academic program  ?Occupational History  ? Not on file  ?Tobacco Use  ? Smoking status: Never  ? Smokeless tobacco: Never  ?Vaping Use  ? Vaping Use: Never used  ?Substance and Sexual Activity  ? Alcohol use: Not on file  ?  Comment: Rare  ? Drug use: Never  ? Sexual activity: Not on file  ?Other Topics Concern  ? Not on file  ?Social History Narrative  ? Lives  at home with wife and children  ? Right handed  ? Caffeine: 1 C per day  ? ?Social Determinants of Health  ? ?Financial Resource Strain: Not on file  ?Food Insecurity: Not on file  ?Transportation Needs: Not on file  ?Physical Activity: Not on file  ?Stress: Not on file  ?Social Connections: Not on file  ? ? ?Family History  ?Problem Relation Age of Onset  ? Heart disease Mother   ? Heart disease Father   ? ? ?Past Medical History:  ?Diagnosis Date  ? Anxiety   ? Hypertension 11/15/2008  ? Throat cancer ([place])   ? ? ?Past Surgical History:  ?Procedure Laterality Date  ? ANTERIOR CERVICAL DECOMP/DISCECTOMY FUSION  07/13/2012  ? Procedure: ANTERIOR  CERVICAL DECOMPRESSION/DISCECTOMY FUSION 3 LEVELS;  Surgeon: Sinclair Ship, MD;  Location: [facility];  Service: Orthopedics;  Laterality: Right;  Anterior cervical decompression fusion, cervical 4-5, cervical 5-6, cervical 6-7 with instrumentation, allograft.  ? KNEE ARTHROSCOPY    ? Left Knee x2.  ? RHINOPLASTY  1994  ? deviated septum  ? SHOULDER SURGERY Left 08/06/2021  ? TOOTH EXTRACTION  09/2017  ?  ? ?Current Outpatient Medications on File Prior to Visit  ?Medication Sig Dispense Refill  ? amLODipine (NORVASC) 10 MG tablet Take 10 mg by mouth daily.    ? aspirin EC 81 MG tablet Take 81 mg by mouth daily.    ? COMBIGAN 0.2-0.5 % ophthalmic solution Apply 1 drop to eye daily. 1 drop in Right eye morning and night.    ? FLUoxetine (PROZAC) 20 MG capsule Take 20 mg by mouth daily.    ? gabapentin (NEURONTIN) 300 MG capsule Take 300 mg by mouth 3 (three) times daily.    ? lisinopril-hydrochlorothiazide (PRINZIDE,ZESTORETIC) 20-12.5 MG per tablet Take 1 tablet by mouth daily.    ? LORazepam (ATIVAN) 0.5 MG tablet Take 0.5 mg by mouth daily.    ? Multiple Vitamin (MULTIVITAMIN WITH MINERALS) TABS Take 1 tablet by mouth daily.    ? rosuvastatin (CRESTOR) 10 MG tablet TAKE 1 TABLET(10 MG) BY MOUTH DAILY 90 tablet 3  ? tamsulosin (FLOMAX) 0.4 MG CAPS capsule Take 0.8 mg by mouth daily.    ? ?No current facility-administered medications on file prior to visit.  ? ? ?No Known Allergies ? ?Physical exam: ? ?Today's Vitals  ? 02/16/22 1430  ?BP: 135/76  ?Pulse: 62  ?Weight: 182 lb 8 oz (82.8 kg)  ?Height: 6' (1.829 m)  ? ?Body mass index is 24.75 kg/m?.  ? ?Wt Readings from Last 3 Encounters:  ?02/16/22 182 lb 8 oz (82.8 kg)  ?08/27/20 182 lb (82.6 kg)  ?02/05/20 194 lb (88 kg)  ?  ? ?Ht Readings from Last 3 Encounters:  ?02/16/22 6' (1.829 m)  ?08/27/20 6' (1.829 m)  ?02/05/20 6' (1.829 m)  ?  ?  ?General: The patient is awake, alert and appears not in acute distress. The patient is well groomed. ?Head: Normocephalic,  atraumatic. Cardiovascular:  Regular rate and cardiac rhythm by pulse,  without distended neck veins. ?Respiratory: Lungs are clear to auscultation.  ?Skin:  Without evidence of ankle edema, or rash. ?Trunk: The patient's posture is erect. ?  ?Neurologic exam : ?The patient is awake and alert, oriented to place and time.   ?Memory subjective described as impaired.  ? ?  02/16/2022  ?  2:44 PM  ?Montreal Cognitive Assessment   ?Visuospatial/ Executive (0/5) 4  ?Naming (0/3) 3  ?Attention: Read list of  digits (0/2) 2  ?Attention: Read list of letters (0/1) 1  ?Attention: Serial 7 subtraction starting at 100 (0/3) 2  ?Language: Repeat phrase (0/2) 1  ?Language : Fluency (0/1) 1  ?Abstraction (0/2) 2  ?Delayed Recall (0/5) 4  ?Orientation (0/6) 6  ?Total 26  ?  ?Attention span & concentration ability appears limited , he is pressured, always moving, excessive mimic.  ?Speech is fluent,  without  dysarthria, dysphonia or aphasia.  ?Mood and affect are defensive. . ?  ?Cranial nerves: no loss of smell or taste reported  ?Pupils are equal and briskly reactive to light. Funduscopic exam deferred. [name]  ?Extraocular movements in vertical and horizontal planes were intact and without nystagmus. No Diplopia. ?Visual fields by finger perimetry are intact. ?Hearing was intact to soft voice and finger rubbing.   ? Facial sensation intact to fine touch. ? Facial motor strength is symmetric and tongue and uvula move midline.  ?Neck ROM : rotation, tilt and flexion extension were normal for age and shoulder shrug was symmetrical.  ?  ?Motor exam:  Symmetric bulk, tone and ROM.   ?Normal tone without cog wheeling, symmetric grip strength . ?  ?Sensory:  Fine touch, pinprick and vibration were tested  and  normal.  ?Proprioception tested in the upper extremities was normal. ?  ?Coordination: Rapid alternating movements in the fingers/hands were of normal speed.  ?The Finger-to-nose maneuver was intact without evidence of ataxia, dysmetria  or tremor. ?  ?Gait and station: Patient could rise unassisted from a seated position, walked without assistive device.  ?Stance is of normal width/ base . ?Deep tendon reflexes: in the  upper and low

## 2022-02-17 ENCOUNTER — Telehealth: Payer: Self-pay | Admitting: Neurology

## 2022-02-17 NOTE — Telephone Encounter (Signed)
Medicare/tricare order sent to [facility], [facility] they will reach out to the patient to schedule.  °

## 2022-03-04 ENCOUNTER — Ambulatory Visit
Admission: RE | Admit: 2022-03-04 | Discharge: 2022-03-04 | Disposition: A | Discharge status: Home / Self Care | Payer: Medicare Other | Source: Ambulatory Visit | Attending: Neurology | Admitting: Neurology

## 2022-03-04 ENCOUNTER — Encounter: Payer: Self-pay | Admitting: Psychology

## 2022-03-04 DIAGNOSIS — Z8659 Personal history of other mental and behavioral disorders: Secondary | ICD-10-CM

## 2022-03-04 DIAGNOSIS — R413 Other amnesia: Secondary | ICD-10-CM

## 2022-03-04 MED ORDER — GADOBENATE DIMEGLUMINE 529 MG/ML IV SOLN
17.0000 mL | Freq: Once | INTRAVENOUS | Status: AC | PRN
  Administered 2022-03-04: 17 mL via INTRAVENOUS

## 2022-03-05 ENCOUNTER — Encounter: Payer: Self-pay | Admitting: Neurology

## 2022-06-02 ENCOUNTER — Telehealth: Payer: Self-pay | Admitting: Neurology

## 2022-06-02 NOTE — Telephone Encounter (Signed)
Caleb Hoffman  Caleb Hoffman, [name], MD [name],  We were unable to schedule this patient, we  called twice , there is no answer  and we left two voicemail to call us back .  Thank you so much for your referral!    The patient will not be called again, he has to call the PT center himself.  02-16-2022

## 2022-08-25 ENCOUNTER — Encounter: Payer: Medicare Other | Attending: Psychology | Admitting: Psychology

## 2022-08-25 DIAGNOSIS — R413 Other amnesia: Secondary | ICD-10-CM | POA: Insufficient documentation

## 2022-08-25 DIAGNOSIS — G4733 Obstructive sleep apnea (adult) (pediatric): Secondary | ICD-10-CM | POA: Insufficient documentation

## 2022-08-25 DIAGNOSIS — G478 Other sleep disorders: Secondary | ICD-10-CM | POA: Insufficient documentation

## 2022-08-25 DIAGNOSIS — Z8659 Personal history of other mental and behavioral disorders: Secondary | ICD-10-CM | POA: Insufficient documentation

## 2022-08-26 ENCOUNTER — Encounter: Payer: Self-pay | Admitting: Psychology
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
## 2022-08-31 DIAGNOSIS — R413 Other amnesia: Secondary | ICD-10-CM

## 2022-08-31 NOTE — Progress Notes (Unsigned)
Behavioral Observations The patient was well-groomed and appropriately dressed. His manners were polite and appropriate to the situation. The patient demonstrated a positive attitude toward testing, showed good effort, and was compliant with all testing instructions.  Neuropsychology Note  Caleb Hoffman completed 120 minutes of neuropsychological testing with technician, Caleb Hoffman, BA, under the supervision of Ilean Skill, PsyD., Clinical Neuropsychologist. The patient did not appear overtly distressed by the testing session, per behavioral observation or via self-report to the technician. Rest breaks were offered.   Clinical Decision Making: In considering the patient's current level of functioning, level of presumed impairment, nature of symptoms, emotional and behavioral responses during clinical interview, level of literacy, and observed level of motivation/effort, a battery of tests was selected by Dr. Sima Matas during initial consultation on 08/25/2022. This was communicated to the technician. Communication between the neuropsychologist and technician was ongoing throughout the testing session and changes were made as deemed necessary based on patient performance on testing, technician observations and additional pertinent factors such as those listed above.  Tests Administered: Controlled Oral Word Association Test (COWAT; FAS & Animals)  Wechsler Adult Intelligence Scale, 4th Edition (WAIS-IV) Wechsler Memory Scale, 4th Edition (WMS-IV); Older Adult Battery   Results:  WAIS-IV  Composite Score Summary  Scale Sum of Scaled Scores Composite Score Percentile Rank 95% Conf. Interval Qualitative Description  Verbal Comprehension 35 VCI 108 70 102-113 Average  Perceptual Reasoning 26 PRI 92 30 86-99 Average  Working Memory 26 WMI 117 87 109-123 High Average  Processing Speed 22 PSI 105 63 96-113 Average  Full Scale 109 FSIQ 106 66 102-110 Average  General Ability 61 GAI  101 53 96-106 Average       Verbal Comprehension Subtests Summary  Subtest Raw Score Scaled Score Percentile Rank Reference Group Scaled Score SEM  Similarities 28 12 75 11 1.04  Vocabulary 43 11 63 12 0.67  Information 18 12 75 13 0.73  (Comprehension) 23 10 50 10 1.08        Perceptual Reasoning Subtests Summary  Subtest Raw Score Scaled Score Percentile Rank Reference Group Scaled Score SEM  Block Design '24 8 25 6 '$ 1.08  Matrix Reasoning 12 9 37 6 0.90  Visual Puzzles 10 9 37 7 0.85  (Figure Weights) 11 9 37 7 0.95  (Picture Completion) '9 8 25 7 '$ 1.16        Working Doctor, general practice Raw Score Scaled Score Percentile Rank Reference Group Scaled Score SEM  Digit Span 33 14 91 12 0.79  Arithmetic 16 12 75 11 0.99  (Letter-Number Seq.) 20 11 63 10 1.04        Processing Speed Subtests Summary  Subtest Raw Score Scaled Score Percentile Rank Reference Group Scaled Score SEM  Symbol Search 29 11 63 8 1.31  Coding 57 11 63 7 0.99  (Cancellation) 40 11 63 10 1.34        WMS-IV  Index Score Summary  Index Sum of Scaled Scores Index Score Percentile Rank 95% Confidence Interval Qualitative Descriptor  Auditory Memory (AMI) 42 102 55 96-108 Average  Visual Memory (VMI) 15 87 19 82-92 Low Average  Immediate Memory (IMI) 28 95 37 89-101 Average  Delayed Memory (DMI) 29 98 45 91-106 Average       Primary Subtest Scaled Score Summary  Subtest Domain Raw Score Scaled Score Percentile Rank  Logical Memory I AM 29 9 37  Logical Memory II AM 19 10 50  Verbal Paired Associates  I AM 25 11 63  Verbal Paired Associates II AM 8 12 75  Visual Reproduction I VM '28 8 25  '$ Visual Reproduction II VM '12 7 16  '$ Symbol Span VWM 21 11 63       Auditory Memory Process Score Summary  Process Score Raw Score Scaled Score Percentile Rank Cumulative Percentage (Base Rate)  LM II Recognition 23 - - >75%  VPA II Recognition 30 - - >75%        Visual  Memory Process Score Summary  Process Score Raw Score Scaled Score Percentile Rank Cumulative Percentage (Base Rate)  VR II Recognition 5 - - 51-75%       ABILITY-MEMORY ANALYSIS  Ability Score:  VCI: 108 Date of Testing:  WAIS-IV; WMS-IV 2022/08/31  Predicted Difference Method   Index Predicted WMS-IV Index Score Actual WMS-IV Index Score Difference Critical Value  Significant Difference Y/N Base Rate  Auditory Memory 104 102 2 9.51 N   Visual Memory 104 87 17 8.07 Y 10-15%  Immediate Memory 105 95 10 10.30 N   Delayed Memory 104 98 6 11.51 N   Statistical significance (critical value) at the .01 level.        COWAT FAS Total = 41 Z = -0.10 Animals Total = 23 Z = 1.14       Feedback to Patient: Caleb Hoffman will return on 09/07/2022 for further testing and again on 02/21/2023 for an interactive feedback session with Dr. Sima Matas at which time his test performances, clinical impressions and treatment recommendations will be reviewed in detail. The patient understands he can contact our office should he require our assistance before this time.  120 minutes spent face-to-face with patient administering standardized tests, 30 minutes spent scoring Environmental education officer). [CPT [id number], [id number]]  Full report to follow.

## 2022-09-07 DIAGNOSIS — R413 Other amnesia: Secondary | ICD-10-CM | POA: Diagnosis not present

## 2022-09-07 NOTE — Progress Notes (Signed)
   Behavioral Observations The patient appeared well-groomed and appropriately dressed. His manners were polite and appropriate to the situation. The patient's attitude toward testing was positive and he showed good effort. The patient had difficulty understanding the Discriminate Reaction Time Test - several trials were completed.    The patient reported that he was unable to obtain results from previous testing (completed ~20 years ago) but that he remembers being told by his doctor about a slight learning disability.  Neuropsychology Note  Caleb Hoffman completed 120 minutes of neuropsychological testing with technician, Dina Rich, BA, under the supervision of Ilean Skill, PsyD., Clinical Neuropsychologist. The patient did not appear overtly distressed by the testing session, per behavioral observation or via self-report to the technician. Rest breaks were offered.   Clinical Decision Making: In considering the patient's current level of functioning, level of presumed impairment, nature of symptoms, emotional and behavioral responses during clinical interview, level of literacy, and observed level of motivation/effort, a battery of tests was selected by Dr. Sima Matas during initial consultation on 08/25/2022. This was communicated to the technician. Communication between the neuropsychologist and technician was ongoing throughout the testing session and changes were made as deemed necessary based on patient performance on testing, technician observations and additional pertinent factors such as those listed above.  Tests Administered: Comprehensive Attention Battery (CAB) Continuous Performance Test (CPT)  Results: Will be included in final report   Feedback to Patient: Caleb Hoffman will return on 02/21/2023 or sooner for an interactive feedback session with Dr. Sima Matas at which time his test performances, clinical impressions and treatment recommendations will be reviewed in  detail. The patient understands he can contact our office should he require our assistance before this time.  120 minutes spent face-to-face with patient administering standardized tests, 30 minutes spent scoring Environmental education officer). [CPT [id number], [id number]]  Full report to follow.

## 2022-09-08 ENCOUNTER — Encounter: Payer: Medicare Other | Admitting: Psychology

## 2022-10-11 ENCOUNTER — Encounter: Payer: Medicare Other | Attending: Psychology | Admitting: Psychology

## 2022-10-11 ENCOUNTER — Encounter: Payer: Self-pay | Admitting: Psychology

## 2022-10-11 DIAGNOSIS — G4733 Obstructive sleep apnea (adult) (pediatric): Secondary | ICD-10-CM

## 2022-10-11 DIAGNOSIS — R413 Other amnesia: Secondary | ICD-10-CM | POA: Diagnosis present

## 2022-10-11 DIAGNOSIS — G478 Other sleep disorders: Secondary | ICD-10-CM | POA: Diagnosis present

## 2022-10-11 DIAGNOSIS — Z8659 Personal history of other mental and behavioral disorders: Secondary | ICD-10-CM
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
## 2022-10-14 ENCOUNTER — Encounter: Payer: Medicare Other | Admitting: Psychology

## 2022-11-10 ENCOUNTER — Encounter: Payer: Medicare Other | Attending: Psychology | Admitting: Psychology

## 2022-11-10 DIAGNOSIS — Z8659 Personal history of other mental and behavioral disorders: Secondary | ICD-10-CM | POA: Insufficient documentation

## 2022-11-10 DIAGNOSIS — R413 Other amnesia: Secondary | ICD-10-CM | POA: Insufficient documentation

## 2022-11-10 DIAGNOSIS — G4733 Obstructive sleep apnea (adult) (pediatric): Secondary | ICD-10-CM

## 2022-11-10 DIAGNOSIS — G478 Other sleep disorders: Secondary | ICD-10-CM | POA: Diagnosis present

## 2022-11-15 ENCOUNTER — Encounter: Payer: Self-pay | Admitting: Psychology

## 2022-11-15 NOTE — Progress Notes (Signed)
Neuropsychological Evaluation     Patient:                           Caleb Hoffman        DOB:                               November 17, 1950   MR Number:                  [id number]   Location:                        [facility] PHYSICAL MEDICINE AND REHABILITATION [place], [place] [id number] [place] [id number] Dept: [phone]   Start:                              1 PM End:                                2 PM   Provider/Observer:                           Edgardo Roys PsyD   Chief Complaint:                                    Chief Complaint  Patient presents with   Memory Loss   Other      Word finding difficulties      Today I provided feedback regarding the results of the recent neuropsychological evaluation.  This evaluation can be found in its entirety in the patient's EMR dated 10/11/2022.  I have included the reason for service as well as the summary from that report below for convenience.  Reason For Service:                         Caleb Hoffman is a 72 year old male referred for neuropsychological evaluation by [name], MD as part of a larger neurological work-up.  The patient is reporting increasing issues with memory particularly short-term memory and difficulties with attention and multitasking.  The patient reports that he has difficulty in multistep process.  He also reports increasing agitation and being more inclined to be reactive to any type of perceived challenge particularly from his wife with agitation and defensiveness.  The patient had been followed initially by [name] and his care was taken over by [name].  The patient has had issues in the past related to sleep disturbance with identified mild obstructive sleep apnea without CPAP need with excessive daytime sleepiness, restless leg syndrome and disturbance and REM sleep patterns.  The patient has a history of  anxiety and prior traumatic events/PTSD symptoms.  Beyond his sleep disturbance the patient also has been treated for malignant neoplasm of tonsillar fossa.  Patient is also continues with some difficulties related to head/neck trauma in the past and has had cervical fusion.    Impression/Diagnosis:                     Overall, the results of  the current neuropsychological evaluation are quite encouraging.  The patient describes episodic times of word finding difficulties but objective assessment of lexical and semantic fluency variables were all within normal limits.  The patient describes problems with attention and concentration and there were significant difficulties with shifting attention but auditory and visual encoding and sustaining attention were all within normal limits or better.  The patient did particularly well and sustaining attentional capacity.  The patient had some mild relative weakness with regard to visual spatial and visual constructional capacity but these were still within normal limits and only slightly below predicted levels of premorbid functioning.  There were no consistent findings of any memory impairments and the patient did very well on measures of auditory and visual learning when placed in a recognition/cued recall format.   As far as diagnostic considerations, I do think that the patient is likely seeing a worsening in his real world cognitive performance due to a multitude of functional issues.  The patient has had more anxiety and depressive symptoms with increased hypervigilance over any errors that he may make.  Some PTSD is likely also playing a role with previous accidents and residual effects particularly with regard to his neck injury.  Ongoing significant sleep disturbances noted.  While the patient showed only mild sleep apnea without a need for CPAP device he continues to have patterns consistent with REM behavioral disorder, restless leg syndrome and other rhythm  disturbance.  The patient has difficulty with sleep and is using lorazepam at night to help fall asleep and takes gabapentin at bedtime for his pain and restless leg syndrome.  There are significant psychosocial stressors the patient has noted with increased conflicts between he and his wife that are particularly become prominent now that the patient is retired and has much less outside life stimulation.  Frustrations and marital stress are likely playing a role increasing his anxiety and depressive symptomatology.  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  The patient has continued to take Prozac that has been effective for him although he has been taking it for many years now.   As far as treatment recommendations, I would continue to work on improving overall sleep quality and seeing what more we can do with regard to his REM disturbance.  I would also highly recommend the patient and his wife engage in some marital counseling as significant changes in their day-to-day interactions after the patient retired can commonly cause increased stress and conflicts within her marriage.  We do have a baseline assessment at this point and while there are no indications of cognitive strengths and weaknesses suggesting any type of progressive neurological disorder the patient does have a history of multiple concussions and some residual chronic PTSD from those accidents.  I will sit down with the patient reviewed the results of the current neuropsychological evaluation and going to greater depth regarding some of the behavioral and psychological tools that may be more helpful in improving the quality of his life.   Diagnosis:                                Memory loss   Mild obstructive sleep apnea   History of posttraumatic stress disorder (PTSD)   Abnormal REM sleep      _____________________ Ilean Skill, Psy.D. Clinical Neuropsychologist

## 2022-11-19 ENCOUNTER — Telehealth: Payer: Self-pay | Admitting: *Deleted

## 2022-11-19 NOTE — Patient Outreach (Signed)
  Care Coordination   11/19/2022 Name: Caleb Hoffman MRN: [id number] DOB: 1951/10/20   Care Coordination Outreach Attempts:  An unsuccessful telephone outreach was attempted today to offer the patient information about available care coordination services as a benefit of their health plan.   Follow Up Plan:  Additional outreach attempts will be made to offer the patient care coordination information and services.   Encounter Outcome:  No Answer   Care Coordination Interventions:  No, not indicated    Raina Mina, RN Care Management Coordinator [place] Office [phone]

## 2023-01-06 LAB — LAB REPORT - SCANNED: EGFR: 84

## 2023-02-03 ENCOUNTER — Other Ambulatory Visit: Payer: Self-pay | Admitting: Cardiovascular Disease

## 2023-02-21 ENCOUNTER — Ambulatory Visit: Payer: Medicare Other | Admitting: Psychology

## 2023-03-15 NOTE — Progress Notes (Unsigned)
Cardiology Office Note:    Date:  03/16/2023   ID:  Caleb Hoffman, DOB September 06, 1951, MRN [id number]  PCP:  Shon Hale, MD [facility] Cardiologist: Nicki Guadalajara, MD    Reason for visit: 2.5 year follow-up, needs med refill  History of Present Illness:    Caleb Hoffman is a 72 y.o. 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  Previous PET imaging detected evidence for aortic atherosclerosis.  Echocardiogram obtained on 01/09/2020 showed EF 60 to 65%, no regional wall motion abnormality, no significant valve issue.  He underwent coronary calcium scoring in early 2021 which placed him at 42nd percentile for age and sex matched control.  Given evidence for mild coronary plaque, Dr. Tresa Endo recommended more aggressive lipid management with target LDL of less than 70.  Patient states he has a family history of CAD with mom had CABG at 37.  He has 4 siblings without known coronary disease.  He is not a smoker or diabetic.  His last seen by Azalee Course in October 2021 and was doing well.  Today, he states he is doing well without symptoms.  He exercises every day.  He goes to the [facility] and does cardio 3 days a week and treadmill the other 4 days.  He avoids heavy lifting and does more reps.  He states he has a little lightheadedness when bending over.  He denies chest pain, shortness of breath, PND, orthopnea, palpitations, syncope and lower extremity edema.    Past Medical History:  Diagnosis Date   Anxiety    Hypertension 11/15/2008   Throat cancer [facility])     Past Surgical History:  Procedure Laterality Date   ANTERIOR CERVICAL DECOMP/DISCECTOMY FUSION  07/13/2012   Procedure: ANTERIOR CERVICAL DECOMPRESSION/DISCECTOMY FUSION 3 LEVELS;  Surgeon: Emilee Hero, MD;  Location: [facility] OR;  Service: Orthopedics;  Laterality: Right;  Anterior cervical decompression fusion,  cervical 4-5, cervical 5-6, cervical 6-7 with instrumentation, allograft.   KNEE ARTHROSCOPY     Left Knee x2.   RHINOPLASTY  1994   deviated septum   SHOULDER SURGERY Left 08/06/2021   TOOTH EXTRACTION  09/2017    Current Medications: Current Meds  Medication Sig   amLODipine (NORVASC) 10 MG tablet Take 10 mg by mouth daily.   aspirin EC 81 MG tablet Take 81 mg by mouth daily.   COMBIGAN 0.2-0.5 % ophthalmic solution Apply 1 drop to eye daily. 1 drop in Right eye morning and night.   FLUoxetine (PROZAC) 20 MG capsule Take 20 mg by mouth daily.   gabapentin (NEURONTIN) 300 MG capsule Take 300 mg by mouth as needed (nerve pain).   lisinopril-hydrochlorothiazide (PRINZIDE,ZESTORETIC) 20-12.5 MG per tablet Take 1 tablet by mouth daily.   LORazepam (ATIVAN) 0.5 MG tablet Take 0.5 mg by mouth daily.   Multiple Vitamin (MULTIVITAMIN WITH MINERALS) TABS Take 1 tablet by mouth daily.   tamsulosin (FLOMAX) 0.4 MG CAPS capsule Take 0.8 mg by mouth daily.   [DISCONTINUED] rosuvastatin (CRESTOR) 10 MG tablet TAKE 1 TABLET(10 MG) BY MOUTH DAILY     Allergies:   Patient has no known allergies.   Social History   Socioeconomic History   Marital status: Married    Spouse name: Rossie Muskrat   Number of children: 2   Years of education: Not on file   Highest education level: Associate degree: academic program  Occupational History   Not on  file  Tobacco Use   Smoking status: Never   Smokeless tobacco: Never  Vaping Use   Vaping Use: Never used  Substance and Sexual Activity   Alcohol use: Not on file    Comment: Rare   Drug use: Never   Sexual activity: Not on file  Other Topics Concern   Not on file  Social History Narrative   Lives at home with wife and children   Right handed   Caffeine: 1 C per day   Social Determinants of Health   Financial Resource Strain: Not on file  Food Insecurity: Not on file  Transportation Needs: No Transportation Needs (06/12/2019)   PRAPARE -  Administrator, Civil Service (Medical): No    Lack of Transportation (Non-Medical): No  Physical Activity: Not on file  Stress: Not on file  Social Connections: Not on file     Family History: The patient's family history includes Heart disease in his father and mother.  ROS:   Please see the history of present illness.     EKGs/Labs/Other Studies Reviewed:    EKG:  The ekg ordered today demonstrates sinus bradycardia, heart rate 49.  Recent Labs: No results found for requested labs within last 365 days.   Recent Lipid Panel Lab Results  Component Value Date/Time   CHOL 125 08/22/2020 09:19 AM   TRIG 42 08/22/2020 09:19 AM   HDL 71 08/22/2020 09:19 AM   LDLCALC 43 08/22/2020 09:19 AM    Physical Exam:    VS:  BP 118/68   Pulse (!) 49   Ht 6' (1.829 m)   Wt 183 lb 12.8 oz (83.4 kg)   SpO2 94%   BMI 24.93 kg/m    No data found.   Wt Readings from Last 3 Encounters:  03/16/23 183 lb 12.8 oz (83.4 kg)  02/16/22 182 lb 8 oz (82.8 kg)  08/27/20 182 lb (82.6 kg)     GEN: Well nourished, well developed in no acute distress, looks younger than stated age. HEENT: Normal NECK: No JVD; No carotid bruits CARDIAC: RRR, no murmurs, rubs, gallops RESPIRATORY:  Clear to auscultation without rales, wheezing or rhonchi  ABDOMEN: Soft, non-tender, non-distended MUSCULOSKELETAL: No edema SKIN: Warm and dry NEUROLOGIC:  Alert and oriented PSYCHIATRIC:  Normal affect     ASSESSMENT AND PLAN   Coronary artery calcifications -Calcium score 52 in 2021; 42% for age and sex matched control  -goal LDL less than 70 -Continue aspirin 81 mg daily and Crestor 10 mg daily. -Continue regular exercise. -Without anginal symptoms, he can follow-up with [place] as needed.  Hypertension, well-controlled -Continue amlodipine and lisinopril HCTZ. -Goal BP is <130/80.  Recommend DASH diet (high in vegetables, fruits, low-fat dairy products, whole grains, poultry, fish, and nuts and  low in sweets, sugar-sweetened beverages, and red meats), salt restriction and increase physical activity.  Hyperlipidemia with goal LDL less than 70 -LDL 53 in February 2024 on Crestor 10 mg daily. -Will refill Crestor today for 1 year.  I do not think he needs regular follow-up with cardiology if PCP will pick up his statin refills. -Discussed cholesterol lowering diets - Mediterranean diet, DASH diet, vegetarian diet, low-carbohydrate diet and avoidance of trans fats.  Discussed healthier choice substitutes.  Nuts, high-fiber foods, and fiber supplements may also improve lipids.    Disposition - Follow-up as needed with cardiology.  Medication Adjustments/Labs and Tests Ordered: Current medicines are reviewed at length with the patient today.  Concerns regarding medicines  are outlined above.  Orders Placed This Encounter  Procedures   EKG 12-Lead   Meds ordered this encounter  Medications   rosuvastatin (CRESTOR) 10 MG tablet    Sig: TAKE 1 TABLET(10 MG) BY MOUTH DAILY    Dispense:  90 tablet    Refill:  3    Patient Instructions  Medication Instructions:  No Changes *If you need a refill on your cardiac medications before your next appointment, please call your pharmacy*   Lab Work: No labs If you have labs (blood work) drawn today and your tests are completely normal, you will receive your results only by: MyChart Message (if you have MyChart) OR A paper copy in the mail If you have any lab test that is abnormal or we need to change your treatment, we will call you to review the results.   Testing/Procedures: No Testing   Follow-Up: At [facility], you and your health needs are our priority.  As part of our continuing mission to provide you with exceptional heart care, we have created designated Provider Care Teams.  These Care Teams include your primary Cardiologist (physician) and Advanced Practice Providers (APPs -  Physician Assistants and Nurse  Practitioners) who all work together to provide you with the care you need, when you need it.  We recommend signing up for the patient portal called "MyChart".  Sign up information is provided on this After Visit Summary.  MyChart is used to connect with patients for Virtual Visits (Telemedicine).  Patients are able to view lab/test results, encounter notes, upcoming appointments, etc.  Non-urgent messages can be sent to your provider as well.   To learn more about what you can do with MyChart, go to [email or web address].    Your next appointment:   Follow Up As Needed  Provider:   Nicki Guadalajara, MD     Signed, Cannon Kettle, PA-C  03/16/2023 12:06 PM    [facility]

## 2023-03-16 ENCOUNTER — Encounter: Payer: Self-pay | Admitting: Physician Assistant

## 2023-03-16 ENCOUNTER — Ambulatory Visit: Payer: Medicare Other | Attending: Physician Assistant | Admitting: Physician Assistant

## 2023-03-16 VITALS — BP 118/68 | HR 49 | Ht 72.0 in | Wt 183.8 lb

## 2023-03-16 DIAGNOSIS — I10 Essential (primary) hypertension: Secondary | ICD-10-CM

## 2023-03-16 DIAGNOSIS — E785 Hyperlipidemia, unspecified: Secondary | ICD-10-CM | POA: Diagnosis present

## 2023-03-16 DIAGNOSIS — I2510 Atherosclerotic heart disease of native coronary artery without angina pectoris: Secondary | ICD-10-CM

## 2023-03-16 DIAGNOSIS — I2584 Coronary atherosclerosis due to calcified coronary lesion: Secondary | ICD-10-CM

## 2023-03-16 MED ORDER — ROSUVASTATIN CALCIUM 10 MG PO TABS: ORAL_TABLET | ORAL | 3 refills | Status: AC

## 2023-03-16 NOTE — Patient Instructions (Signed)
Medication Instructions:  No Changes *If you need a refill on your cardiac medications before your next appointment, please call your pharmacy*   Lab Work: No labs If you have labs (blood work) drawn today and your tests are completely normal, you will receive your results only by: MyChart Message (if you have MyChart) OR A paper copy in the mail If you have any lab test that is abnormal or we need to change your treatment, we will call you to review the results.   Testing/Procedures: No Testing   Follow-Up: At [facility], you and your health needs are our priority.  As part of our continuing mission to provide you with exceptional heart care, we have created designated Provider Care Teams.  These Care Teams include your primary Cardiologist (physician) and Advanced Practice Providers (APPs -  Physician Assistants and Nurse Practitioners) who all work together to provide you with the care you need, when you need it.  We recommend signing up for the patient portal called "MyChart".  Sign up information is provided on this After Visit Summary.  MyChart is used to connect with patients for Virtual Visits (Telemedicine).  Patients are able to view lab/test results, encounter notes, upcoming appointments, etc.  Non-urgent messages can be sent to your provider as well.   To learn more about what you can do with MyChart, go to [email or web address].    Your next appointment:   Follow Up As Needed  Provider:   Nicki Guadalajara, MD
